# Patient Record
Sex: Female | Born: 1965 | Race: White | Hispanic: No | Marital: Married | Smoking: Former smoker
Health system: Southern US, Community
[De-identification: ages and names within clinical notes are randomized; demographics above are authoritative.]

## PROBLEM LIST (undated history)

## (undated) DIAGNOSIS — E079 Disorder of thyroid, unspecified: Secondary | ICD-10-CM

## (undated) HISTORY — DX: Disorder of thyroid, unspecified: E07.9

## (undated) HISTORY — PX: APPENDECTOMY: SHX54

## (undated) HISTORY — PX: TOE SURGERY: SHX1073

---

## 2011-06-25 ENCOUNTER — Ambulatory Visit: Payer: Self-pay

## 2011-10-31 DIAGNOSIS — E039 Hypothyroidism, unspecified: Secondary | ICD-10-CM | POA: Insufficient documentation

## 2011-11-28 DIAGNOSIS — M79609 Pain in unspecified limb: Secondary | ICD-10-CM | POA: Insufficient documentation

## 2011-11-28 DIAGNOSIS — R350 Frequency of micturition: Secondary | ICD-10-CM | POA: Insufficient documentation

## 2012-08-16 IMAGING — CR DG KNEE COMPLETE 4+V*R*
1 series · 4 of 4 positions shown · non-contrast
Comparison: none

REASON FOR EXAM: swollen
COMMENTS:

[Series 1: view not recorded · 0.17mm/px · 4 of 4 slices shown]
[im 1/4]
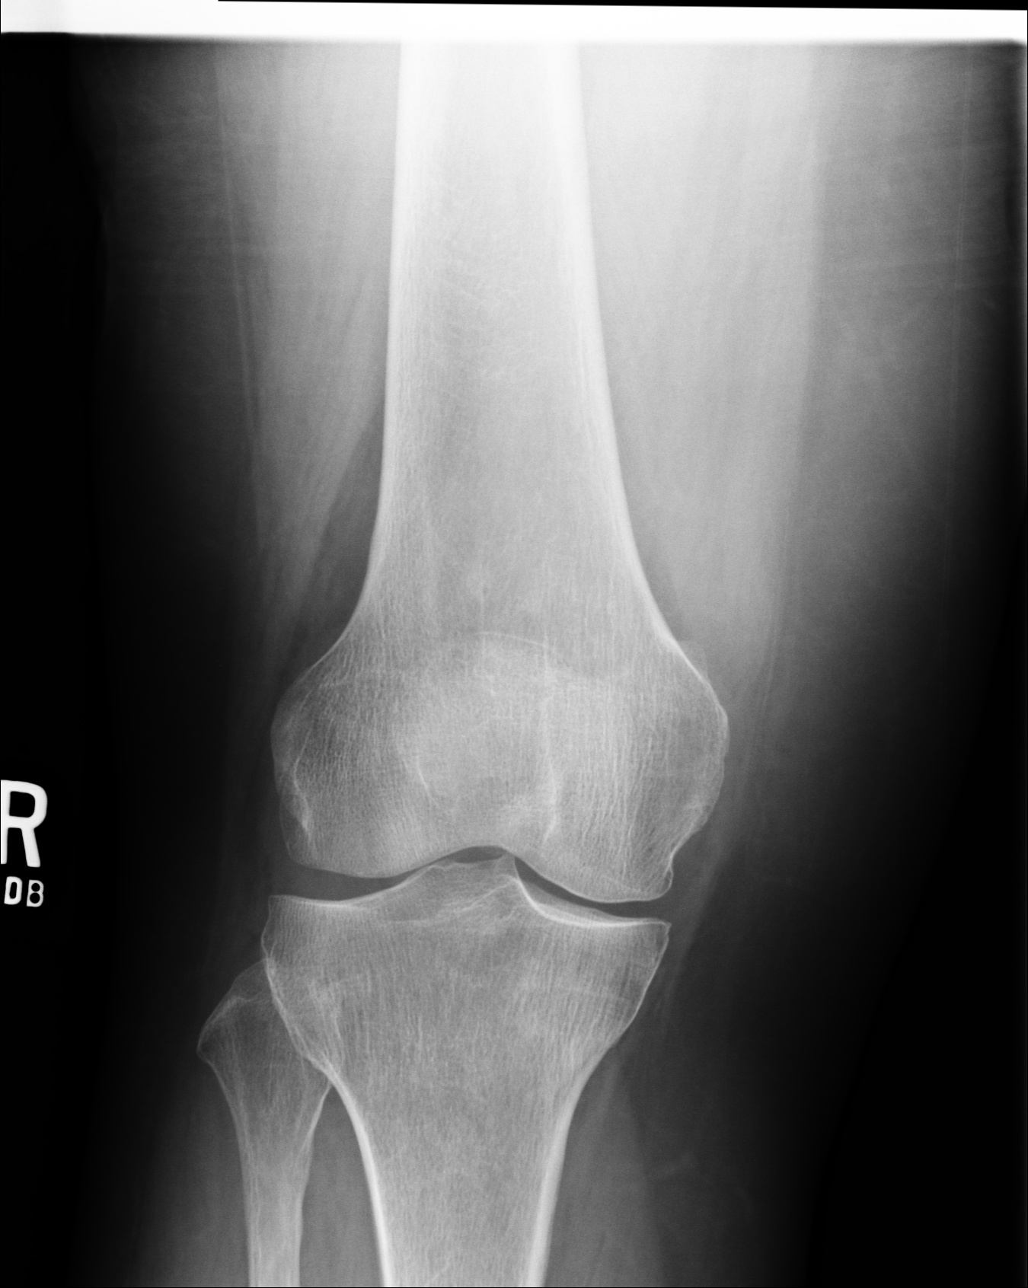
[im 2/4]
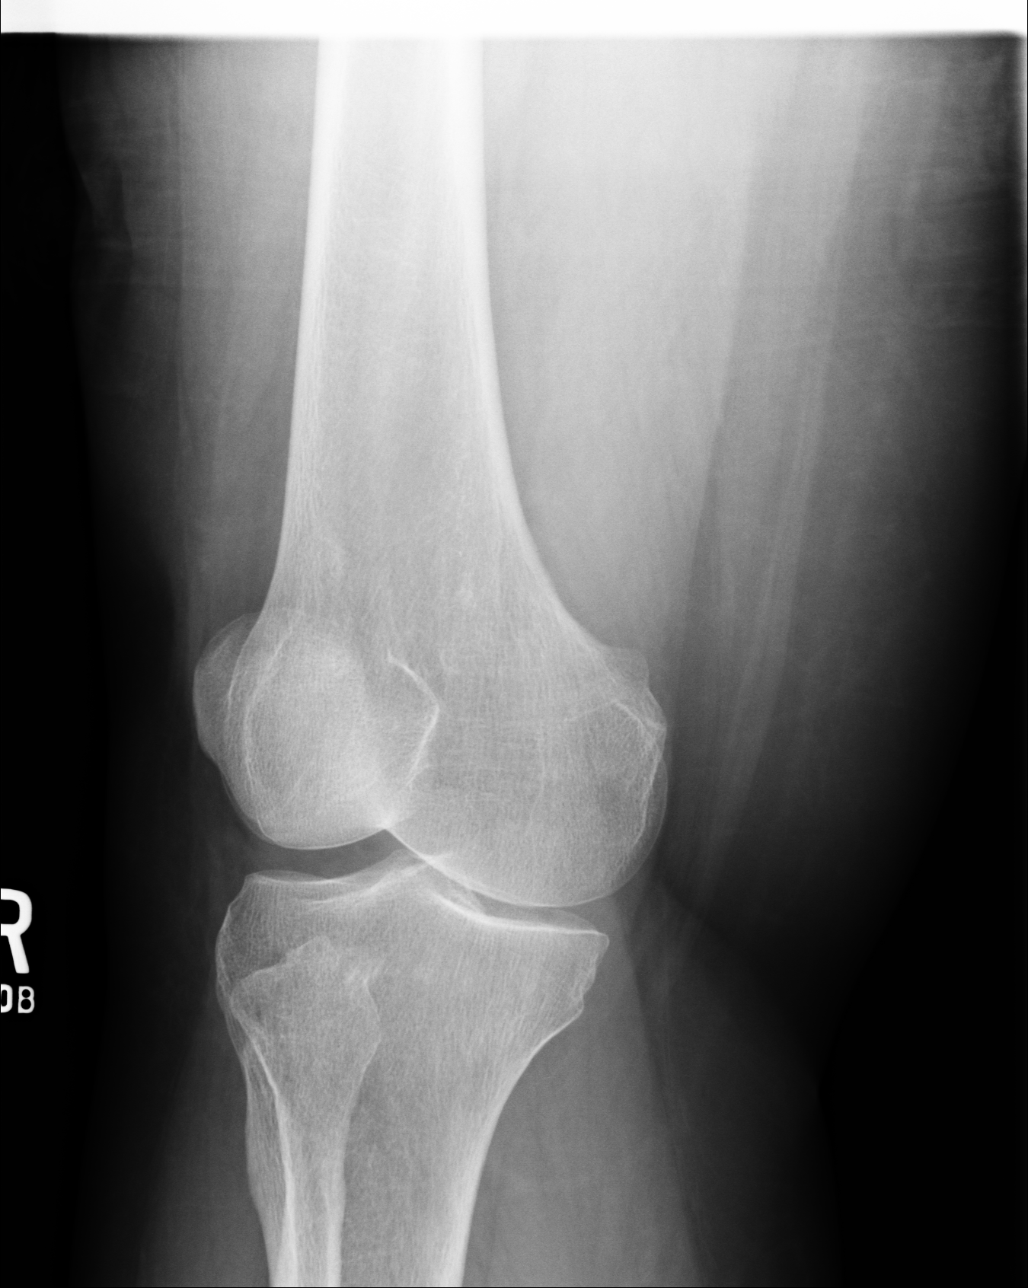
[im 3/4]
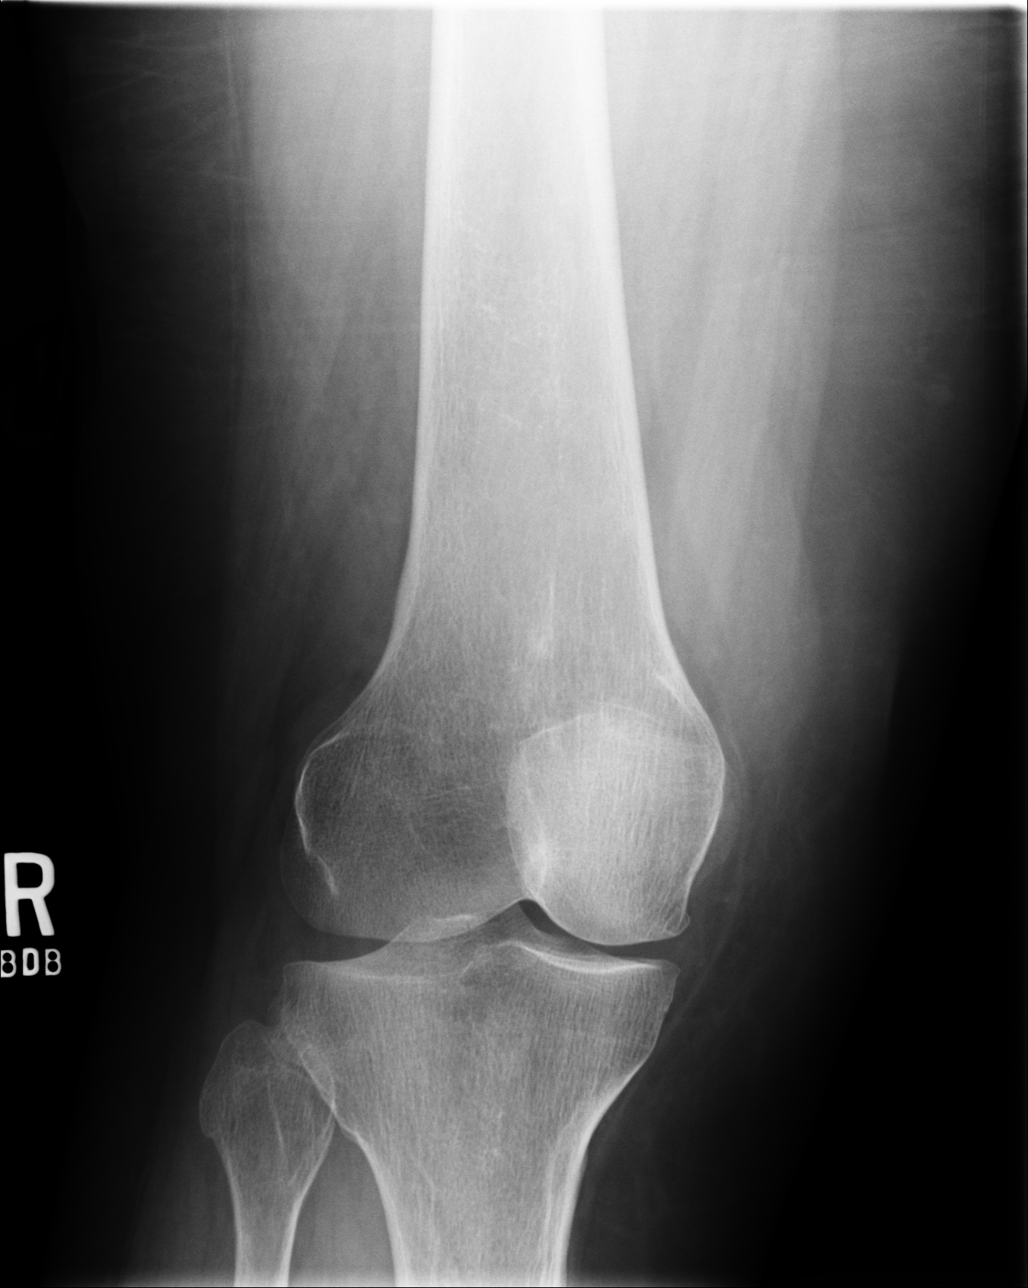
[im 4/4]
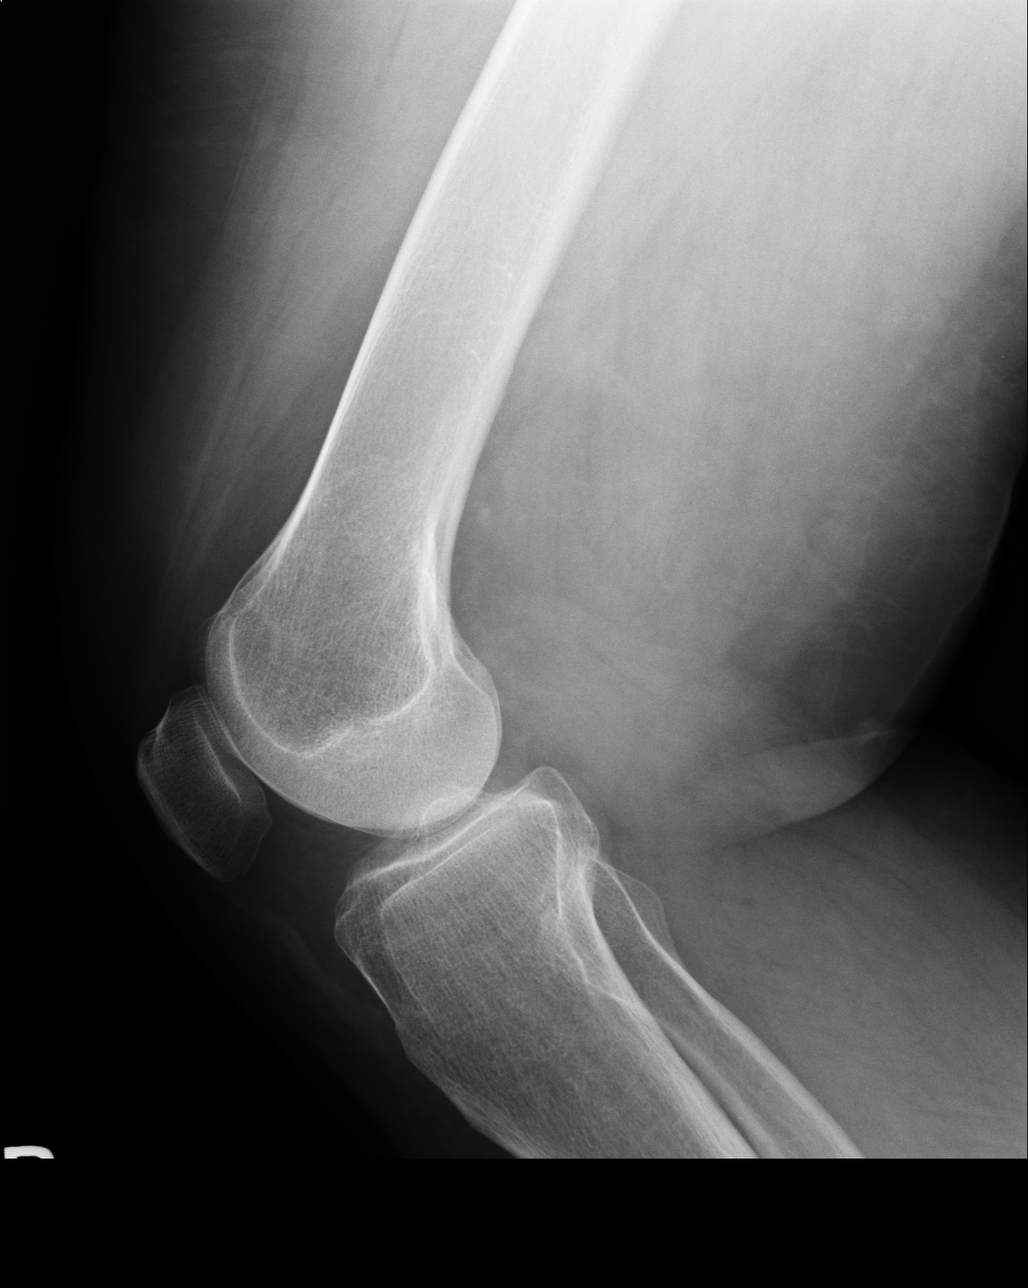

[4 of 4 positions shown; findings below may reference images not displayed]

PROCEDURE:     MDR - MDR KNEE RT COMPLETE W/OBLIQUES  - June 25, 2011 [DATE]

RESULT:     No fracture, dislocation or other acute bony abnormality is
identified. There is observed very slight spur formation at the knee
medially consistent with arthritic change. The knee joint space medially
appears slightly narrowed which also would be consistent with arthritic
change. There is no sclerosis of the adjacent articular surfaces and no
associated cystic changes about the knee joint are seen. The patella is
intact.
IMPRESSION: 1. There are minimal arthritic changes observed medially at the right knee
as noted above.
2. No fracture or other acute bony abnormality is seen.

## 2014-02-17 DIAGNOSIS — R5383 Other fatigue: Secondary | ICD-10-CM | POA: Insufficient documentation

## 2014-02-17 DIAGNOSIS — E669 Obesity, unspecified: Secondary | ICD-10-CM | POA: Insufficient documentation

## 2014-09-01 ENCOUNTER — Ambulatory Visit: Payer: Self-pay | Admitting: Emergency Medicine

## 2014-09-21 ENCOUNTER — Ambulatory Visit (INDEPENDENT_AMBULATORY_CARE_PROVIDER_SITE_OTHER): Payer: Managed Care, Other (non HMO)

## 2014-09-21 ENCOUNTER — Ambulatory Visit (INDEPENDENT_AMBULATORY_CARE_PROVIDER_SITE_OTHER): Payer: Managed Care, Other (non HMO) | Admitting: Podiatry

## 2014-09-21 ENCOUNTER — Encounter: Payer: Self-pay | Admitting: Podiatry

## 2014-09-21 VITALS — BP 140/84 | HR 65 | Resp 16 | Ht 69.0 in | Wt 218.0 lb

## 2014-09-21 DIAGNOSIS — M2042 Other hammer toe(s) (acquired), left foot: Secondary | ICD-10-CM

## 2014-09-21 DIAGNOSIS — M779 Enthesopathy, unspecified: Secondary | ICD-10-CM

## 2014-09-21 DIAGNOSIS — M898X0 Other specified disorders of bone, multiple sites: Secondary | ICD-10-CM

## 2014-09-21 DIAGNOSIS — Q786 Multiple congenital exostoses: Secondary | ICD-10-CM

## 2014-09-21 MED ORDER — TRIAMCINOLONE ACETONIDE 10 MG/ML IJ SUSP: 10.0000 mg | Freq: Once | INTRAMUSCULAR | Status: DC

## 2014-09-21 NOTE — Progress Notes (Signed)
   Subjective:    Patient ID: Destiny BurnerRuth Bernice Williamson, female    DOB: [birth year], 48 y.o.   MRN: [id number]  HPI Comments: The right ankle has a burning sensation and swells by the end of the day, also my little toe on the left foot has a spot on it that rubs and it is real irritating   Foot Pain      Review of Systems  All other systems reviewed and are negative.      Objective:   Physical Exam        Assessment & Plan:

## 2014-09-21 NOTE — Progress Notes (Signed)
Subjective:     Patient ID: Destiny BurnerRuth Bernice Williamson, female   DOB: 06/22/1966, 48 y.o.   MRN: [id number]  HPI patient states I have a lot of pain in the lateral side of my right ankle and a very painful corn on the fifth toe left foot. You fixed 1 on my right foot years ago but this left one has been really bothering me and my ankles been hurting me for several months   Review of Systems  All other systems reviewed and are negative.      Objective:   Physical Exam  Constitutional: She is oriented to person, place, and time.  Cardiovascular: Intact distal pulses.   Musculoskeletal: Normal range of motion.  Neurological: She is oriented to person, place, and time.  Skin: Skin is warm.  Nursing note and vitals reviewed.  neurovascular status intact with muscle strength adequate and range of motion the subtalar and midtarsal joint normal. She does have some splinting on the lateral side of the right foot secondary to her pain which is in the sinus tarsi and the lateral ankle gutter but I did not note any crepitus within the joint surface. Left fifth toe inside there is a small keratotic lesion consistent with bone spur     Assessment:     Lateral side tendinitis around the subtalar joint and mild discomfort in the sinus tarsi right along with exostosis with keratotic lesion fifth toe left    Plan:     Reviewed both conditions and today did a careful injection of the lateral side right 3 mm Kenalog 5 mg Xylocaine and debrided the lesion on the left fifth toe and discussed probable exostectomy to be done in the office. Patient will reappoint 2 weeks to see how she responded to medication and to decide what else may be appropriate

## 2014-10-08 ENCOUNTER — Ambulatory Visit: Payer: Managed Care, Other (non HMO) | Admitting: Podiatry

## 2014-10-08 ENCOUNTER — Ambulatory Visit (INDEPENDENT_AMBULATORY_CARE_PROVIDER_SITE_OTHER): Payer: Managed Care, Other (non HMO) | Admitting: Podiatry

## 2014-10-08 VITALS — BP 128/72 | HR 79 | Resp 16

## 2014-10-08 DIAGNOSIS — M779 Enthesopathy, unspecified: Secondary | ICD-10-CM

## 2014-10-08 DIAGNOSIS — Q786 Multiple congenital exostoses: Secondary | ICD-10-CM

## 2014-10-08 DIAGNOSIS — M898X0 Other specified disorders of bone, multiple sites: Secondary | ICD-10-CM

## 2014-10-08 DIAGNOSIS — M2042 Other hammer toe(s) (acquired), left foot: Secondary | ICD-10-CM

## 2014-10-10 NOTE — Progress Notes (Signed)
Subjective:     Patient ID: Destiny BurnerRuth Bernice Williamson, female   DOB: 17-Oct-1965, 49 y.o.   MRN: [id number]  HPI patient states I'm doing pretty good with discomfort upon pressure and digital deformity still noted left over right but able to walk better without significant discomfort   Review of Systems     Objective:   Physical Exam Neurovascular status intact with diminishment of discomfort peroneal insertion bilateral with mild hammertoe deformities and exostotic lesion between the toes left over right foot    Assessment:     Improving tendinitis with treatment and hammertoe exostotic deformity    Plan:     Reviewed both conditions and recommended padding for between the toes along with wider shoes with considerations long-term for digital stabilization procedure. Continue ice therapy for the tendon and reappoint if symptoms persist

## 2014-12-21 ENCOUNTER — Ambulatory Visit: Payer: Self-pay | Admitting: Physician Assistant

## 2014-12-21 ENCOUNTER — Ambulatory Visit: Payer: Self-pay | Admitting: Internal Medicine

## 2014-12-21 ENCOUNTER — Inpatient Hospital Stay: Payer: Self-pay | Admitting: Surgery

## 2014-12-31 LAB — URINE CULTURE

## 2015-01-24 LAB — SURGICAL PATHOLOGY

## 2015-01-30 NOTE — H&P (Signed)
PATIENT NAME:  Destiny Williamson, Destiny Williamson MR#:  [id number] DATE OF BIRTH:  07-10-1966  DATE OF ADMISSION:  12/21/2014  [facility]   ADMITTING PHYSICIAN: Dr. Michela PitcherEly.   CHIEF COMPLAINT: Abdominal pain.   BRIEF HISTORY: Destiny FewRuth Williamson is a 49 year old woman with a 2 day history of significant right lower quadrant suprapubic, right-sided abdominal pain. The pain came on suddenly, was of sharp onset, associated with anorexia and mild nausea. She did not vomit. The pain worsened over the first 24 hours.  It was accompanied by a low-grade fever and 2 shaking chills. Because she continued to have symptoms she presented to the acute care unit in [place] today, where workup was really unremarkable with regard to her laboratory values, but CT scan did demonstrate a thickened inflamed appendix with periappendiceal fluid and a question of periappendiceal air, suggesting possible rupture. The patient denies any similar previous problems. She denies any history of hepatitis, yellow jaundice, pancreatitis, peptic ulcer disease, gallbladder disease, or diverticulitis. She has had no previous abdominal surgery. She has had normal bowel function up through yesterday.   PAST MEDICAL HISTORY:  She has no major medical problems except for hypothyroidism.  MEDICATIONS:  She is currently on thyroid replacement at 0.125 mg daily.   She denies history of cardiac disease, hypertension, or diabetes.   ALLERGIES:  She has no medical allergies.   SOCIAL HISTORY:  She does smoke cigarettes. Does not drink alcohol.  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   FAMILY HISTORY: Positive for cancer, cardiac disease, and hypertension.   REVIEW OF SYSTEMS:  Otherwise unremarkable. Specifically she has no shortness of breath and no urinary symptoms. A 10 point review of systems was performed and is otherwise unremarkable.   PHYSICAL EXAMINATION:  GENERAL: She is an alert, pleasant woman in no significant  distress.  VITAL SIGNS: Blood pressure is 128/80, heart rate is 80. She is afebrile.  HEENT: Normal ears. Normal eyes with no scleral icterus and no pupillary abnormalities.  LYMPHATIC:  No cervical or axillary adenopathy.  CHEST: Clear with no adventitious sounds. She has normal pulmonary excursion.  CARDIAC: No murmurs or gallops to my ear and seems to be in normal sinus rhythm.  ABDOMEN:  Generally soft, but she does have point tenderness in the right lower quadrant with marked guarding and rebound with referred rebound. She has hypoactive but present bowel sounds.  EXTREMITIES:  Full range of motion, no deformities, as does her upper extremity examination.  NEUROLOGIC:  No cranial nerve abnormalities and full symmetrical range of motion bilaterally.  SKIN: No major lesions, abrasions, or contusions.  PSYCHIATRIC:  Normal orientation, normal affect.    Impression:   I reviewed her CT scan. She does appear to have a thickened, inflamed appendix with periappendiceal fluid and in this clinical presentation most consistent with acute appendicitis. Because of the question of rupture we would recommend surgical intervention.  We have outlined the risks and benefits of surgery including the possibility of secondary pelvic abscess following surgery. Her husband was present for the interview. They are in agreement with this plan.   TOTAL TIME SPENT:  50 minutes.     ____________________________ Quentin Orealph L. Ely III, MD rle:bu D: 12/21/2014 13:14:27 ET T: 12/21/2014 13:29:06 ET JOB#: [id number]  cc: Quentin Orealph L. Ely III, MD, <Dictator> Quentin OreALPH L ELY MD ELECTRONICALLY SIGNED 12/28/2014 20:46

## 2015-01-30 NOTE — Op Note (Signed)
PATIENT NAME:  Laurey ArrowMORALES, Galit B MR#:  [id number] DATE OF BIRTH:  [birth year]  DATE OF PROCEDURE:  12/21/2014  PREOPERATIVE DIAGNOSIS: Acute appendicitis.   POSTOPERATIVE DIAGNOSIS: Acute gangrenous appendicitis.   PROCEDURE PERFORMED: Laparoscopic appendectomy.   ANESTHESIA: General.   ESTIMATED BLOOD LOSS: 15 mL.  COMPLICATIONS: None.   SPECIMEN: Appendix.   INDICATION FOR SURGERY: Ms. Mariah MillingMorales is a pleasant, 49 year old female, who presents with acute onset of right lower quadrant pain and CT findings concerning for appendicitis.   DETAILS OF PROCEDURE: Informed consent was obtained. Ms. Mariah MillingMorales was brought to the operating room suite. She was induced. Endotracheal tube was placed. General anesthesia was administered. Her abdomen was prepped and draped in standard surgical fashion. A timeout was then performed correctly identifying patient name, operative site and procedure to be performed. A supraumbilical incision was made. It was deepened down to the fascia. The fascia was incised. The peritoneum was entered. Two stay sutures were placed through the fasciotomy. A Hasson trocar was placed in the abdomen. The abdomen was insufflated. A left lower quadrant and suprapubic trocar were placed. I was able to visualize the base of the appendix; however, the tip was extremely thickened and stuck to the surroundings tissues. I then made a hole in the mesoappendix at the base of the appendix and fired across with a laparoscopic stapler. I then proceeded to free up the appendix from the retroperitoneum with blunt dissection,and proceeded to separate the appendix from what appeared to be the bottom of the cecum. I then fired across the mesoappendix with multiple staple loads and brought it out in pieces through an Endo Catch bag through the umbilical incision. I then irrigated the abdomen with normal saline. I looked for hemostasis. I looked for any traumatic injuries to the cecum as it was stuck to the  cecum. In addition, I will note that the appendix appeared gangrenous.   After I was happy with hemostasis, the abdomen was desufflated, trocars were removed under direct visualization. The supraumbilical fascia was closed with figure-of-eight 0 Vicryl. The skin was then closed with 4-0 Monocryl deep dermal sutures. Steri-Strips, Telfa gauze and Tegaderm were used to complete the dressing.   The patient was then awoken, extubated and brought to the postanesthesia care unit. There were no immediate complications.   Needle, sponge, and instrument counts were correct at the end of the procedure.    ____________________________ Si Raiderhristopher A. Dandrea Medders, MD cal:JT D: 12/21/2014 23:13:58 ET T: 12/22/2014 08:40:07 ET JOB#: [id number]  cc: Cristal Deerhristopher A. Calin Fantroy, MD, <Dictator> Jarvis NewcomerHRISTOPHER A Rashun Grattan MD ELECTRONICALLY SIGNED 12/23/2014 19:47

## 2015-02-11 ENCOUNTER — Encounter: Payer: Self-pay | Admitting: Podiatry

## 2015-02-11 ENCOUNTER — Ambulatory Visit (INDEPENDENT_AMBULATORY_CARE_PROVIDER_SITE_OTHER): Payer: Managed Care, Other (non HMO) | Admitting: Podiatry

## 2015-02-11 VITALS — BP 130/71 | HR 61 | Resp 12

## 2015-02-11 DIAGNOSIS — M779 Enthesopathy, unspecified: Secondary | ICD-10-CM | POA: Diagnosis not present

## 2015-02-11 MED ORDER — TRIAMCINOLONE ACETONIDE 10 MG/ML IJ SUSP
10.0000 mg | Freq: Once | INTRAMUSCULAR | Status: AC
  Administered 2015-02-11: 10 mg

## 2015-02-13 NOTE — Progress Notes (Signed)
Subjective:     Patient ID: Destiny Williamson, female   DOB: Feb 16, 1966, 49 y.o.   MRN: [id number]  HPI patient states my right ankle on the outside is still sore and I wanted to make sure that there is nothing else and I know I need an injection   Review of Systems     Objective:   Physical Exam Neurovascular status intact muscle strength adequate with pain in the lateral side right ankle that is more in the lateral ankle gutter versus the sinus tarsi    Assessment:     Inflammatory tendinitis right lateral ankle with minimal sinus tarsitis noted    Plan:     Injected the right ankle 3 mg Kenalog 5 mg Xylocaine and advised on physical therapy. Reappoint to recheck

## 2015-07-06 ENCOUNTER — Encounter: Payer: Managed Care, Other (non HMO) | Admitting: Podiatry

## 2015-09-08 ENCOUNTER — Ambulatory Visit (INDEPENDENT_AMBULATORY_CARE_PROVIDER_SITE_OTHER): Payer: Managed Care, Other (non HMO) | Admitting: Podiatry

## 2015-09-08 ENCOUNTER — Ambulatory Visit (INDEPENDENT_AMBULATORY_CARE_PROVIDER_SITE_OTHER): Payer: Managed Care, Other (non HMO)

## 2015-09-08 ENCOUNTER — Encounter: Payer: Self-pay | Admitting: Podiatry

## 2015-09-08 VITALS — BP 124/77 | HR 78 | Resp 16

## 2015-09-08 DIAGNOSIS — M898X0 Other specified disorders of bone, multiple sites: Secondary | ICD-10-CM

## 2015-09-08 DIAGNOSIS — M779 Enthesopathy, unspecified: Secondary | ICD-10-CM

## 2015-09-08 DIAGNOSIS — Q786 Multiple congenital exostoses: Secondary | ICD-10-CM | POA: Diagnosis not present

## 2015-09-08 MED ORDER — TRIAMCINOLONE ACETONIDE 10 MG/ML IJ SUSP
10.0000 mg | Freq: Once | INTRAMUSCULAR | Status: AC
  Administered 2015-09-08: 10 mg

## 2015-09-08 NOTE — Patient Instructions (Signed)

## 2015-09-09 NOTE — Progress Notes (Signed)
Subjective:     Patient ID: Destiny BurnerRuth Bernice Learn, female   DOB: 06-28-1966, 49 y.o.   MRN: [id number]  HPI patient presents with pain in the right ankle medial side and lateral side and also has a small spur on the fifth toe left foot that's been bothering her. The left ankle has been doing well but just started to hurt in the last 4 weeks   Review of Systems     Objective:   Physical Exam Neurovascular status intact muscle strength adequate with discomfort in the lateral ankle gutter right with mild sinus tarsi pain. Left fifth toe has a small exostosis which can become very painful    Assessment:     Tendinitis right with inflammation along with exostotic lesion left    Plan:     Reviewed both conditions and at this time I injected the tenderness complex right 3 mg Kenalog 5 mg Xylocaine and advised on reduced activity. For the left I discussed exostectomy which she wants to get done but needs to wait until the timing is better for her

## 2015-12-30 NOTE — Progress Notes (Signed)
This encounter was created in error - please disregard.

## 2016-01-31 ENCOUNTER — Emergency Department: Payer: 59

## 2016-01-31 ENCOUNTER — Observation Stay: Payer: 59

## 2016-01-31 ENCOUNTER — Encounter
Admission: EM | Disposition: A | Discharge status: Home / Self Care | Payer: Self-pay | Source: Home / Self Care | Attending: Emergency Medicine

## 2016-01-31 ENCOUNTER — Observation Stay
Admission: EM | Admit: 2016-01-31 | Discharge: 2016-02-02 | Disposition: A | Discharge status: Home / Self Care | Payer: 59 | Attending: Surgery | Admitting: Surgery

## 2016-01-31 ENCOUNTER — Observation Stay: Payer: 59 | Admitting: Certified Registered"

## 2016-01-31 ENCOUNTER — Encounter: Payer: Self-pay | Admitting: Emergency Medicine

## 2016-01-31 DIAGNOSIS — E039 Hypothyroidism, unspecified: Secondary | ICD-10-CM | POA: Insufficient documentation

## 2016-01-31 DIAGNOSIS — K8040 Calculus of bile duct with cholecystitis, unspecified, without obstruction: Secondary | ICD-10-CM | POA: Diagnosis not present

## 2016-01-31 DIAGNOSIS — Z8379 Family history of other diseases of the digestive system: Secondary | ICD-10-CM | POA: Insufficient documentation

## 2016-01-31 DIAGNOSIS — R5383 Other fatigue: Secondary | ICD-10-CM | POA: Diagnosis not present

## 2016-01-31 DIAGNOSIS — R350 Frequency of micturition: Secondary | ICD-10-CM | POA: Insufficient documentation

## 2016-01-31 DIAGNOSIS — Z87891 Personal history of nicotine dependence: Secondary | ICD-10-CM | POA: Diagnosis not present

## 2016-01-31 DIAGNOSIS — E669 Obesity, unspecified: Secondary | ICD-10-CM | POA: Diagnosis not present

## 2016-01-31 DIAGNOSIS — K8050 Calculus of bile duct without cholangitis or cholecystitis without obstruction: Secondary | ICD-10-CM

## 2016-01-31 DIAGNOSIS — R1011 Right upper quadrant pain: Secondary | ICD-10-CM

## 2016-01-31 DIAGNOSIS — M79609 Pain in unspecified limb: Secondary | ICD-10-CM | POA: Insufficient documentation

## 2016-01-31 DIAGNOSIS — R932 Abnormal findings on diagnostic imaging of liver and biliary tract: Secondary | ICD-10-CM | POA: Insufficient documentation

## 2016-01-31 DIAGNOSIS — K8062 Calculus of gallbladder and bile duct with acute cholecystitis without obstruction: Principal | ICD-10-CM | POA: Insufficient documentation

## 2016-01-31 DIAGNOSIS — Z6834 Body mass index (BMI) 34.0-34.9, adult: Secondary | ICD-10-CM | POA: Diagnosis not present

## 2016-01-31 DIAGNOSIS — K8042 Calculus of bile duct with acute cholecystitis without obstruction: Secondary | ICD-10-CM | POA: Diagnosis present

## 2016-01-31 DIAGNOSIS — Z8249 Family history of ischemic heart disease and other diseases of the circulatory system: Secondary | ICD-10-CM | POA: Insufficient documentation

## 2016-01-31 DIAGNOSIS — K838 Other specified diseases of biliary tract: Secondary | ICD-10-CM | POA: Insufficient documentation

## 2016-01-31 DIAGNOSIS — Z833 Family history of diabetes mellitus: Secondary | ICD-10-CM | POA: Insufficient documentation

## 2016-01-31 DIAGNOSIS — K819 Cholecystitis, unspecified: Secondary | ICD-10-CM

## 2016-01-31 HISTORY — PX: CHOLECYSTECTOMY: SHX55

## 2016-01-31 LAB — BASIC METABOLIC PANEL
Anion gap: 9 (ref 5–15)
BUN: 23 mg/dL — AB (ref 6–20)
CO2: 24 mmol/L (ref 22–32)
CREATININE: 0.98 mg/dL (ref 0.44–1.00)
Calcium: 8.8 mg/dL — ABNORMAL LOW (ref 8.9–10.3)
Chloride: 107 mmol/L (ref 101–111)
GFR calc Af Amer: >60 mL/min (ref >60)
Glucose, Bld: 112 mg/dL — ABNORMAL HIGH (ref 65–99)
Potassium: 3.6 mmol/L (ref 3.5–5.1)
SODIUM: 140 mmol/L (ref 135–145)

## 2016-01-31 LAB — CBC
HCT: 40.1 % (ref 35.0–47.0)
Hemoglobin: 13.6 g/dL (ref 12.0–16.0)
MCH: 28.5 pg (ref 26.0–34.0)
MCHC: 33.9 g/dL (ref 32.0–36.0)
MCV: 84.1 fL (ref 80.0–100.0)
PLATELETS: 260 10*3/uL (ref 150–440)
RBC: 4.77 MIL/uL (ref 3.80–5.20)
RDW: 14.1 % (ref 11.5–14.5)
WBC: 6.6 10*3/uL (ref 3.6–11.0)

## 2016-01-31 LAB — HEPATIC FUNCTION PANEL
ALBUMIN: 4 g/dL (ref 3.5–5.0)
ALT: 16 U/L (ref 14–54)
AST: 17 U/L (ref 15–41)
Alkaline Phosphatase: 85 U/L (ref 38–126)
BILIRUBIN TOTAL: 0.4 mg/dL (ref 0.3–1.2)
Bilirubin, Direct: <0.1 mg/dL — ABNORMAL LOW (ref 0.1–0.5)
TOTAL PROTEIN: 7.4 g/dL (ref 6.5–8.1)

## 2016-01-31 LAB — PREGNANCY, URINE: PREG TEST UR: NEGATIVE

## 2016-01-31 LAB — URINALYSIS COMPLETE WITH MICROSCOPIC (ARMC ONLY)
BILIRUBIN URINE: NEGATIVE
Bacteria, UA: NONE SEEN
GLUCOSE, UA: NEGATIVE mg/dL
Hgb urine dipstick: NEGATIVE
KETONES UR: NEGATIVE mg/dL
LEUKOCYTES UA: NEGATIVE
Nitrite: NEGATIVE
PH: 7 (ref 5.0–8.0)
Protein, ur: NEGATIVE mg/dL
Specific Gravity, Urine: 1.013 (ref 1.005–1.030)

## 2016-01-31 LAB — LIPASE, BLOOD: LIPASE: 31 U/L (ref 11–51)

## 2016-01-31 LAB — TROPONIN I: Troponin I: <0.03 ng/mL (ref <0.031)

## 2016-01-31 SURGERY — LAPAROSCOPIC CHOLECYSTECTOMY WITH INTRAOPERATIVE CHOLANGIOGRAM
Anesthesia: General

## 2016-01-31 MED ORDER — OXYCODONE HCL 5 MG PO TABS
5.0000 mg | ORAL_TABLET | ORAL | Status: DC | PRN
  Administered 2016-02-01: 5 mg via ORAL
  Filled 2016-01-31 (×2): qty 1

## 2016-01-31 MED ORDER — DEXTROSE 5 % IV SOLN
2.0000 g | Freq: Once | INTRAVENOUS | Status: AC
  Administered 2016-01-31: 2 g via INTRAVENOUS
  Filled 2016-01-31: qty 2

## 2016-01-31 MED ORDER — BUPIVACAINE HCL (PF) 0.5 % IJ SOLN
INTRAMUSCULAR | Status: DC | PRN
  Administered 2016-01-31: 30 mL

## 2016-01-31 MED ORDER — ONDANSETRON HCL 4 MG/2ML IJ SOLN
4.0000 mg | Freq: Four times a day (QID) | INTRAMUSCULAR | Status: DC | PRN
  Administered 2016-01-31 – 2016-02-01 (×2): 4 mg via INTRAVENOUS
  Filled 2016-01-31: qty 2

## 2016-01-31 MED ORDER — OXYCODONE HCL 5 MG PO TABS: 5.0000 mg | ORAL_TABLET | Freq: Once | ORAL | Status: DC | PRN

## 2016-01-31 MED ORDER — ROCURONIUM BROMIDE 100 MG/10ML IV SOLN
INTRAVENOUS | Status: DC | PRN
  Administered 2016-01-31: 10 mg via INTRAVENOUS
  Administered 2016-01-31: 25 mg via INTRAVENOUS
  Administered 2016-01-31: 40 mg via INTRAVENOUS

## 2016-01-31 MED ORDER — FENTANYL CITRATE (PF) 100 MCG/2ML IJ SOLN: 25.0000 ug | INTRAMUSCULAR | Status: DC | PRN

## 2016-01-31 MED ORDER — MORPHINE SULFATE (PF) 4 MG/ML IV SOLN
4.0000 mg | Freq: Once | INTRAVENOUS | Status: AC
  Administered 2016-01-31: 4 mg via INTRAVENOUS
  Filled 2016-01-31: qty 1

## 2016-01-31 MED ORDER — PANTOPRAZOLE SODIUM 40 MG IV SOLR
40.0000 mg | Freq: Every day | INTRAVENOUS | Status: DC
  Administered 2016-01-31 – 2016-02-01 (×2): 40 mg via INTRAVENOUS
  Filled 2016-01-31 (×2): qty 40

## 2016-01-31 MED ORDER — SUGAMMADEX SODIUM 200 MG/2ML IV SOLN
INTRAVENOUS | Status: DC | PRN
  Administered 2016-01-31: 200 mg via INTRAVENOUS

## 2016-01-31 MED ORDER — CYCLOBENZAPRINE HCL 10 MG PO TABS
10.0000 mg | ORAL_TABLET | Freq: Three times a day (TID) | ORAL | Status: DC
  Administered 2016-01-31 – 2016-02-02 (×4): 10 mg via ORAL
  Filled 2016-01-31 (×5): qty 1

## 2016-01-31 MED ORDER — SODIUM CHLORIDE 0.9 % IV SOLN
INTRAVENOUS | Status: DC | PRN
  Administered 2016-01-31: 20 mL

## 2016-01-31 MED ORDER — BUPIVACAINE HCL (PF) 0.5 % IJ SOLN
INTRAMUSCULAR | Status: AC
Start: 2016-01-31 — End: 2016-01-31
  Filled 2016-01-31: qty 30

## 2016-01-31 MED ORDER — CEFOXITIN SODIUM 2 G IV SOLR
2.0000 g | Freq: Once | INTRAVENOUS | Status: DC
  Filled 2016-01-31: qty 2

## 2016-01-31 MED ORDER — ACETAMINOPHEN 10 MG/ML IV SOLN
INTRAVENOUS | Status: AC
  Filled 2016-01-31: qty 100

## 2016-01-31 MED ORDER — MIDAZOLAM HCL 2 MG/2ML IJ SOLN
INTRAMUSCULAR | Status: DC | PRN
  Administered 2016-01-31: 2 mg via INTRAVENOUS

## 2016-01-31 MED ORDER — CEFAZOLIN SODIUM 1 G IJ SOLR
INTRAMUSCULAR | Status: DC | PRN
  Administered 2016-01-31: 2 g via INTRAMUSCULAR

## 2016-01-31 MED ORDER — DEXTROSE IN LACTATED RINGERS 5 % IV SOLN
INTRAVENOUS | Status: DC
  Administered 2016-01-31 – 2016-02-02 (×6): via INTRAVENOUS
  Filled 2016-01-31 (×2): qty 1000

## 2016-01-31 MED ORDER — KETOROLAC TROMETHAMINE 30 MG/ML IJ SOLN
30.0000 mg | Freq: Four times a day (QID) | INTRAMUSCULAR | Status: DC
  Administered 2016-01-31 – 2016-02-02 (×6): 30 mg via INTRAVENOUS
  Filled 2016-01-31 (×5): qty 1

## 2016-01-31 MED ORDER — LIDOCAINE HCL (CARDIAC) 20 MG/ML IV SOLN
INTRAVENOUS | Status: DC | PRN
  Administered 2016-01-31: 100 mg via INTRAVENOUS

## 2016-01-31 MED ORDER — LACTATED RINGERS IV SOLN
INTRAVENOUS | Status: DC | PRN
  Administered 2016-01-31 (×2): via INTRAVENOUS

## 2016-01-31 MED ORDER — KETOROLAC TROMETHAMINE 30 MG/ML IJ SOLN: 30.0000 mg | Freq: Four times a day (QID) | INTRAMUSCULAR | Status: DC | PRN

## 2016-01-31 MED ORDER — OXYCODONE HCL 5 MG/5ML PO SOLN: 5.0000 mg | Freq: Once | ORAL | Status: DC | PRN

## 2016-01-31 MED ORDER — SUCCINYLCHOLINE CHLORIDE 20 MG/ML IJ SOLN
INTRAMUSCULAR | Status: DC | PRN
  Administered 2016-01-31: 100 mg via INTRAVENOUS

## 2016-01-31 MED ORDER — ONDANSETRON 8 MG PO TBDP: 4.0000 mg | ORAL_TABLET | Freq: Four times a day (QID) | ORAL | Status: DC | PRN

## 2016-01-31 MED ORDER — ACETAMINOPHEN 10 MG/ML IV SOLN
INTRAVENOUS | Status: DC | PRN
  Administered 2016-01-31: 1000 mg via INTRAVENOUS

## 2016-01-31 MED ORDER — LEVOTHYROXINE SODIUM 125 MCG PO TABS
125.0000 ug | ORAL_TABLET | Freq: Every day | ORAL | Status: DC
  Administered 2016-02-02: 125 ug via ORAL
  Filled 2016-01-31 (×2): qty 1

## 2016-01-31 MED ORDER — MORPHINE SULFATE (PF) 2 MG/ML IV SOLN
2.0000 mg | INTRAVENOUS | Status: DC | PRN
  Administered 2016-02-01: 2 mg via INTRAVENOUS
  Filled 2016-01-31: qty 1

## 2016-01-31 MED ORDER — FENTANYL CITRATE (PF) 100 MCG/2ML IJ SOLN
INTRAMUSCULAR | Status: DC | PRN
  Administered 2016-01-31: 100 ug via INTRAVENOUS
  Administered 2016-01-31 (×2): 50 ug via INTRAVENOUS

## 2016-01-31 MED ORDER — PROPOFOL 10 MG/ML IV BOLUS
INTRAVENOUS | Status: DC | PRN
  Administered 2016-01-31: 150 mg via INTRAVENOUS

## 2016-01-31 MED ORDER — DEXAMETHASONE SODIUM PHOSPHATE 10 MG/ML IJ SOLN
INTRAMUSCULAR | Status: DC | PRN
  Administered 2016-01-31: 10 mg via INTRAVENOUS

## 2016-01-31 MED ORDER — ONDANSETRON HCL 4 MG/2ML IJ SOLN
4.0000 mg | Freq: Once | INTRAMUSCULAR | Status: AC
  Administered 2016-01-31: 4 mg via INTRAVENOUS
  Filled 2016-01-31: qty 2

## 2016-01-31 SURGICAL SUPPLY — 40 items
APPLIER CLIP 5 13 M/L LIGAMAX5 (MISCELLANEOUS) ×2
BLADE SURG 15 STRL LF DISP TIS (BLADE) ×1 IMPLANT
BLADE SURG 15 STRL SS (BLADE) ×1
CANISTER SUCT 1200ML W/VALVE (MISCELLANEOUS) ×2 IMPLANT
CATH CHOLANGI 4FR 420404F (CATHETERS) ×2 IMPLANT
CHLORAPREP W/TINT 26ML (MISCELLANEOUS) ×2 IMPLANT
CLIP APPLIE 5 13 M/L LIGAMAX5 (MISCELLANEOUS) ×1 IMPLANT
CONRAY 60ML FOR OR (MISCELLANEOUS) ×2 IMPLANT
DEFOGGER SCOPE WARMER CLEARIFY (MISCELLANEOUS) ×2 IMPLANT
DRAPE SHEET LG 3/4 BI-LAMINATE (DRAPES) ×2 IMPLANT
ELECT CAUTERY BLADE 6.4 (BLADE) ×2 IMPLANT
ELECT E-Z MONOPOLAR 33 (MISCELLANEOUS) ×2
ELECT REM PT RETURN 9FT ADLT (ELECTROSURGICAL) ×2
ELECTRODE E-Z MONOPOLAR 33 (MISCELLANEOUS) ×1 IMPLANT
ELECTRODE REM PT RTRN 9FT ADLT (ELECTROSURGICAL) ×1 IMPLANT
ENDOPOUCH RETRIEVER 10 (MISCELLANEOUS) ×2 IMPLANT
GLOVE PI ORTHOPRO 6.5 (GLOVE) ×1
GLOVE PI ORTHOPRO STRL 6.5 (GLOVE) ×1 IMPLANT
GOWN STRL REUS W/ TWL LRG LVL3 (GOWN DISPOSABLE) ×3 IMPLANT
GOWN STRL REUS W/TWL LRG LVL3 (GOWN DISPOSABLE) ×3
IRRIGATION STRYKERFLOW (MISCELLANEOUS) ×1 IMPLANT
IRRIGATOR STRYKERFLOW (MISCELLANEOUS) ×2
IV CATH ANGIO 12GX3 LT BLUE (NEEDLE) ×2 IMPLANT
IV NS 1000ML (IV SOLUTION) ×1
IV NS 1000ML BAXH (IV SOLUTION) ×1 IMPLANT
LABEL OR SOLS (LABEL) ×2 IMPLANT
LIQUID BAND (GAUZE/BANDAGES/DRESSINGS) ×2 IMPLANT
NEEDLE HYPO 25X1 1.5 SAFETY (NEEDLE) ×2 IMPLANT
NS IRRIG 500ML POUR BTL (IV SOLUTION) ×2 IMPLANT
PACK LAP CHOLECYSTECTOMY (MISCELLANEOUS) ×2 IMPLANT
PENCIL ELECTRO HAND CTR (MISCELLANEOUS) ×2 IMPLANT
SCISSORS METZENBAUM CVD 33 (INSTRUMENTS) ×2 IMPLANT
SLEEVE ENDOPATH XCEL 5M (ENDOMECHANICALS) ×4 IMPLANT
SUT MNCRL 4-0 (SUTURE) ×1
SUT MNCRL 4-0 27XMFL (SUTURE) ×1
SUT VICRYL 0 AB UR-6 (SUTURE) ×2 IMPLANT
SUTURE MNCRL 4-0 27XMF (SUTURE) ×1 IMPLANT
TROCAR XCEL BLUNT TIP 100MML (ENDOMECHANICALS) ×2 IMPLANT
TROCAR XCEL NON-BLD 5MMX100MML (ENDOMECHANICALS) ×2 IMPLANT
TUBING INSUFFLATOR HI FLOW (MISCELLANEOUS) ×2 IMPLANT

## 2016-01-31 NOTE — Transfer of Care (Signed)
Immediate Anesthesia Transfer of Care Note  Patient: Destiny Williamson  Procedure(s) Performed: Procedure(s): LAPAROSCOPIC CHOLECYSTECTOMY WITH INTRAOPERATIVE CHOLANGIOGRAM (N/A)  Patient Location: PACU  Anesthesia Type:General  Level of Consciousness: awake and patient cooperative  Airway & Oxygen Therapy: Patient Spontanous Breathing and Patient connected to face mask oxygen  Post-op Assessment: Report given to RN  Post vital signs: Reviewed and stable  Last Vitals:  Filed Vitals:   01/31/16 1413 01/31/16 1745  BP: 144/68 164/85  Pulse: 59 76  Temp: 36.7 C 37.1 C  Resp:  15    Last Pain:  Filed Vitals:   01/31/16 1748  PainSc: 6          Complications: No apparent anesthesia complications

## 2016-01-31 NOTE — Anesthesia Procedure Notes (Signed)
Procedure Name: Intubation Date/Time: 01/31/2016 4:31 PM Performed by: Junious SilkNOLES, Ashya Nicolaisen Pre-anesthesia Checklist: Patient identified, Patient being monitored, Timeout performed, Emergency Drugs available and Suction available Patient Re-evaluated:Patient Re-evaluated prior to inductionOxygen Delivery Method: Circle system utilized Preoxygenation: Pre-oxygenation with 100% oxygen Intubation Type: IV induction Ventilation: Mask ventilation without difficulty Laryngoscope Size: Mac and 3 Grade View: Grade I Tube type: Oral Tube size: 7.0 mm Number of attempts: 1 Airway Equipment and Method: Stylet Placement Confirmation: ETT inserted through vocal cords under direct vision,  positive ETCO2 and breath sounds checked- equal and bilateral Secured at: 21 cm Tube secured with: Tape Dental Injury: Teeth and Oropharynx as per pre-operative assessment

## 2016-01-31 NOTE — Anesthesia Preprocedure Evaluation (Signed)
Anesthesia Evaluation  Patient identified by MRN, date of birth, ID band Patient awake    Reviewed: Allergy & Precautions, H&P , NPO status , Patient's Chart, lab work & pertinent test results  History of Anesthesia Complications Negative for: history of anesthetic complications  Airway Mallampati: III  TM Distance: >3 FB Neck ROM: full    Dental  (+) Poor Dentition, Chipped   Pulmonary neg shortness of breath, former smoker,    Pulmonary exam normal breath sounds clear to auscultation       Cardiovascular Exercise Tolerance: Good (-) angina(-) Past MI and (-) DOE negative cardio ROS Normal cardiovascular exam Rhythm:regular Rate:Normal     Neuro/Psych negative neurological ROS  negative psych ROS   GI/Hepatic negative GI ROS, Neg liver ROS, neg GERD  ,  Endo/Other  Hypothyroidism   Renal/GU negative Renal ROS  negative genitourinary   Musculoskeletal   Abdominal   Peds  Hematology negative hematology ROS (+)   Anesthesia Other Findings Past Medical History:   Thyroid disease                                             Past Surgical History:   TOE SURGERY                                                   APPENDECTOMY                                                 BMI    Body Mass Index   34.97 kg/m 2      Reproductive/Obstetrics negative OB ROS                             Anesthesia Physical Anesthesia Plan  ASA: III  Anesthesia Plan: General ETT   Post-op Pain Management:    Induction:   Airway Management Planned:   Additional Equipment:   Intra-op Plan:   Post-operative Plan:   Informed Consent: I have reviewed the patients History and Physical, chart, labs and discussed the procedure including the risks, benefits and alternatives for the proposed anesthesia with the patient or authorized representative who has indicated his/her understanding and acceptance.    Dental Advisory Given  Plan Discussed with: Anesthesiologist, CRNA and Surgeon  Anesthesia Plan Comments:         Anesthesia Quick Evaluation

## 2016-01-31 NOTE — ED Notes (Signed)
Pt anxious and tearful. Explained to pt that we are carefully monitoring her and that we can see everything on her monitor out at the desk. Pt visibly relaxed and verbalized understanding.

## 2016-01-31 NOTE — ED Notes (Addendum)
Pt ambulatory to STAT registration desk without difficulty, tearful; reports awakening at 4am with upper CP radiating into back and underneath breasts; denies hx of same; accomp by nausea; pt taken immediately to room 3 via w/c by Lauryn RN to be placed on card monitor for EKG

## 2016-01-31 NOTE — Op Note (Signed)
Laparoscopic Cholecystectomy with IOC Procedure Note  Indications: This patient presents with symptomatic gallbladder disease and will undergo laparoscopic cholecystectomy.  Pre-operative Diagnosis: Calculus of bile duct with acute cholecystitis and obstruction  Post-operative Diagnosis: Same  Surgeon: Gladis Riffleatherine L Vinay Ertl   Assistants: none  Anesthesia: General endotracheal anesthesia  ASA Class: 2  Procedure Details  The patient was seen again in the Holding Room. The risks, benefits, complications, treatment options, and expected outcomes were discussed with the patient. The possibilities of reaction to medication, pulmonary aspiration, perforation of viscus, bleeding, recurrent infection, finding a normal gallbladder, the need for additional procedures, failure to diagnose a condition, the possible need to convert to an open procedure, and creating a complication requiring transfusion or operation were discussed with the patient. The patient and husband concurred with the proposed plan, giving informed consent. The site of surgery properly noted/marked. The patient was taken to Operating Room, identified as Destiny Williamson and the procedure verified as Laparoscopic Cholecystectomy with Intraoperative Cholangiograms. A Time Out was held and the above information confirmed.  Prior to the induction of general anesthesia, antibiotic prophylaxis was administered. General endotracheal anesthesia was then administered and tolerated well. After the induction, the abdomen was prepped in the usual sterile fashion. The patient was positioned in the supine position with the left arm comfortably tucked, along with some reverse Trendelenburg.  Local anesthetic agent was injected into the skin near the umbilicus and an incision made. The midline fascia was incised and the Hasson technique was used to introduce a 10 mm port under direct vision. It was secured with a figure of eight Vicryl suture placed  in the usual fashion. Pneumoperitoneum was then created with CO2 and tolerated well without any adverse changes in the patient's vital signs. Additional trocars were introduced under direct vision. All skin incisions were infiltrated with a local anesthetic agent before making the incision and placing the trocars.   The gallbladder was identified, the fundus grasped and retracted cephalad. Adhesions were lysed bluntly and with the electrocautery where indicated, taking care not to injure any adjacent organs or viscus. The gallbladder was very inflamed and not easy to gasp. Laparoscopic needle was inserted into the gallbladder and clear fluid was returned from the gallbladder, enough so it could be grasped. The infundibulum was grasped and retracted laterally, exposing the peritoneum overlying the triangle of Calot. This was then divided and exposed in a blunt fashion. The cystic duct was clearly identified and bluntly dissected circumferentially. The junctions of the gallbladder, cystic duct and common bile duct were clearly identified prior to the division of any linear structure.   An incision was made in the cystic duct and the cholangiogram catheter introduced. The catheter was secured using an endoclip. The study showed good visualization of the proximal and distal biliary tree with dilation, without filling into the duodenum, concern for large stone the distal CBD though no frank stone was observed. The catheter was then removed.   The cystic duct was then doubly ligated with surgical clips on the patient side and singly clipped on the gallbladder side and divided. The cystic artery was identified, dissected free, ligated with clips and divided as well.   The gallbladder was dissected from the liver bed in retrograde fashion with the electrocautery. The gallbladder was removed. The liver bed was irrigated and inspected. Hemostasis was achieved with the electrocautery. Copious irrigation was utilized  and was repeatedly aspirated until clear all particulate matter.  Pneumoperitoneum was completely reduced after viewing  removal of the trocars under direct vision. The wound was thoroughly irrigated and the fascia was then closed with a figure of eight suture; the skin was then closed with 4-0 Monocryl and a sterile glue was applied.  Instrument, sponge, and needle counts were correct at closure and at the conclusion of the case.   Findings: Cholecystitis with Cholelithiasis, unable to get dye into duodenum on cholangiogram, concern for large stone as distal CBD  Estimated Blood Loss: 50         Drains: none         Total IV Fluids:         Specimens: Gallbladder           Complications: None; patient tolerated the procedure well.         Disposition: PACU - hemodynamically stable.         Condition: stable

## 2016-01-31 NOTE — H&P (Signed)
Destiny Williamson is an 50 y.o. female.   Chief Complaint: epigastric pain  HPI: 50yrold female with a past medical history of hypothyroidism came in today because of sudden onset of pain in the epigastrium and right upper quadrant starting last night. Patient states that he does not quit since it started. Patient states that these and she would move bumps in the road would make the pain worse. Patient states that she has received some pain medication which did help however the pain is still layer just went from an 8 down to a 3. Patient states that the pain feels as if it moved around both of her sides and through to her back. Patient also states that she does have some pain underneath the right shoulder blade. Patient denies any fever chills. Patient denies ever having pain like this previously. Patient ate calzone prior to the pain. Patient did have some nausea but no vomiting. Patient otherwise denies fever, chills, vomiting, diarrhea, constipation, jaundice, or dysuria.  Past Medical History  Diagnosis Date  . Thyroid disease     Past Surgical History  Procedure Laterality Date  . Toe surgery    . Appendectomy      Family History:  Mother--gallbladder disease;  HTN, DM in grandparents  Social History: Former smoker: smoked about 144yrat 1ppd but quite approximately 20 years ago,  She very rarely drinks EtOH, denies any smokeless tobacco or illicit drugs  Allergies: No Known Allergies   (Not in a hospital admission)  Results for orders placed or performed during the hospital encounter of 01/31/16 (from the past 48 hour(s))  Basic metabolic panel     Status: Abnormal   Collection Time: 01/31/16  6:36 AM  Result Value Ref Range   Sodium 140 135 - 145 mmol/L   Potassium 3.6 3.5 - 5.1 mmol/L   Chloride 107 101 - 111 mmol/L   CO2 24 22 - 32 mmol/L   Glucose, Bld 112 (H) 65 - 99 mg/dL   BUN 23 (H) 6 - 20 mg/dL   Creatinine, Ser 0.98 0.44 - 1.00 mg/dL   Calcium 8.8 (L) 8.9 - 10.3  mg/dL   GFR calc non Af Amer >60 >60 mL/min   GFR calc Af Amer >60 >60 mL/min    Comment: (NOTE) The eGFR has been calculated using the CKD EPI equation. This calculation has not been validated in all clinical situations. eGFR's persistently <60 mL/min signify possible Chronic Kidney Disease.    Anion gap 9 5 - 15  CBC     Status: None   Collection Time: 01/31/16  6:36 AM  Result Value Ref Range   WBC 6.6 3.6 - 11.0 K/uL   RBC 4.77 3.80 - 5.20 MIL/uL   Hemoglobin 13.6 12.0 - 16.0 g/dL   HCT 40.1 35.0 - 47.0 %   MCV 84.1 80.0 - 100.0 fL   MCH 28.5 26.0 - 34.0 pg   MCHC 33.9 32.0 - 36.0 g/dL   RDW 14.1 11.5 - 14.5 %   Platelets 260 150 - 440 K/uL  Troponin I     Status: None   Collection Time: 01/31/16  6:36 AM  Result Value Ref Range   Troponin I <0.03 <0.031 ng/mL    Comment:        NO INDICATION OF MYOCARDIAL INJURY.   Hepatic function panel     Status: Abnormal   Collection Time: 01/31/16  6:36 AM  Result Value Ref Range   Total Protein 7.4 6.5 -  8.1 g/dL   Albumin 4.0 3.5 - 5.0 g/dL   AST 17 15 - 41 U/L   ALT 16 14 - 54 U/L   Alkaline Phosphatase 85 38 - 126 U/L   Total Bilirubin 0.4 0.3 - 1.2 mg/dL   Bilirubin, Direct <0.1 (L) 0.1 - 0.5 mg/dL   Indirect Bilirubin NOT CALCULATED 0.3 - 0.9 mg/dL  Lipase, blood     Status: None   Collection Time: 01/31/16  6:36 AM  Result Value Ref Range   Lipase 31 11 - 51 U/L  Troponin I     Status: None   Collection Time: 01/31/16  9:40 AM  Result Value Ref Range   Troponin I <0.03 <0.031 ng/mL    Comment:        NO INDICATION OF MYOCARDIAL INJURY.    Dg Chest Portable 1 View  01/31/2016  CLINICAL DATA:  Patient woke at 4 a.m. with upper chest pain radiating to the back. Nausea. Former smoker. EXAM: PORTABLE CHEST 1 VIEW COMPARISON:  None. FINDINGS: The heart size and mediastinal contours are within normal limits. Both lungs are clear. The visualized skeletal structures are unremarkable. IMPRESSION: No active disease.  Electronically Signed   By: Lucienne Capers M.D.   On: 01/31/2016 06:54   US Abdomen Limited Ruq  01/31/2016  CLINICAL DATA:  Right upper quadrant pain . EXAM: US ABDOMEN LIMITED - RIGHT UPPER QUADRANT COMPARISON:  CT 12/21/2014 FINDINGS: Gallbladder: Multiple gallstones noted. Multiple appear non mobile. Stones noted within the neck of the gallbladder. The largest measures 1.8 cm gallbladder wall thickness 2.8 mm. No pericholecystic fluid collection. Negative Murphy sign. Common bile duct: Diameter: 14.3 mm. Debris within the distal common bile duct cannot be completely excluded. Liver: Liver is echogenic consistent fatty infiltration and/or hepatocellular disease. IMPRESSION: 1. Multiple gallstones. Some appear non mobile. Gallstones in the neck of the gallbladder. 2. Prominently dilated common bile duct at 14.3 mm. Debris within the distal common bile duct cannot be excluded. MRCP can be obtained as needed. Electronically Signed   By: Marcello Moores  Register   On: 01/31/2016 08:28    Review of Systems  Constitutional: Positive for malaise/fatigue. Negative for fever, chills, weight loss and diaphoresis.  HENT: Negative for congestion and sore throat.   Respiratory: Negative for cough, sputum production and shortness of breath.   Cardiovascular: Negative for chest pain, palpitations and leg swelling.  Gastrointestinal: Positive for nausea, vomiting and abdominal pain. Negative for heartburn, diarrhea, constipation and blood in stool.  Genitourinary: Negative for dysuria, hematuria and flank pain.  Musculoskeletal: Negative for myalgias and back pain.  Skin: Negative for itching and rash.  Neurological: Negative for dizziness, loss of consciousness, weakness and headaches.  Psychiatric/Behavioral: The patient is not nervous/anxious.   All other systems reviewed and are negative.   Blood pressure 129/53, pulse 57, temperature 97.3 F (36.3 C), temperature source Oral, resp. rate 18, height _0  (1.727  m), weight 230 lb (104.327 kg), SpO2 98 %. Physical Exam  Vitals reviewed. Constitutional: She is oriented to person, place, and time. She appears well-developed and well-nourished. No distress.  HENT:  Head: Normocephalic and atraumatic.  Right Ear: External ear normal.  Left Ear: External ear normal.  Nose: Nose normal.  Mouth/Throat: Oropharynx is clear and moist. No oropharyngeal exudate.  Eyes: Conjunctivae and EOM are normal. Pupils are equal, round, and reactive to light. No scleral icterus.  Neck: Normal range of motion. Neck supple. No tracheal deviation present. No thyromegaly present.  Cardiovascular:  Normal rate, regular rhythm and intact distal pulses.  Exam reveals no gallop and no friction rub.   No murmur heard. Respiratory: Effort normal and breath sounds normal. No respiratory distress. She has no wheezes. She has no rales.  GI: Soft. Bowel sounds are normal. She exhibits no distension. There is no rebound and no guarding.  Epigastric to RUQ abdominal pain, + murphy's sign, Right CVA tenderness   Neurological: She is alert and oriented to person, place, and time. No cranial nerve deficit.  Skin: Skin is warm and dry. No rash noted. No erythema. No pallor.  Psychiatric: She has a normal mood and affect. Her behavior is normal. Judgment and thought content normal.     Assessment/Plan 50 year old female with acute cholecystitis and choledocholithiasis. I personally reviewed the patient's past medical history which only has some hypothyroidism.  Personally reviewed her laboratory values which include a normal white blood cell count as well as normal LFTs and bilirubin. I personally reviewed her ultrasound images which do show an enlarged, common bile duct up to 14.3 mm, along with multiple gallstones however without any thickening of the wall or pericholecystic fluid. No reviewed the radiology read as above.  Given the patient's acute onset of symptoms along with a positive  Murphy sign on physical exam do believe that she is suffering from acute cholecystitis however with her enlarged common bile duct, I have some suspicion for choledocholithiasis as well. The risks, benefits, complications, treatment options, and expected outcomes were discussed with the patient and husband. The possibilities of bleeding, recurrent infection, finding a normal gallbladder, perforation of viscus organs, damage to surrounding structures, bile leak, abscess formation, needing a drain placed, the need for additional procedures, reaction to medication, pulmonary aspiration,  failure to diagnose a condition, the possible need to convert to an open procedure, and creating a complication requiring transfusion or operation were discussed with the patient. I also discussed with the patient that given the findings of the enlarged common bile duct that that would be shot through the gallbladder to evaluate for stones and these would be attempted to be flushed out of the time of the operation but if they were unable to do so that she would need a further procedure, ERCP. The patient and husband concurred with the proposed plan, giving informed consent.      Hubbard Robinson, MD 01/31/2016, 1:06 PM

## 2016-01-31 NOTE — ED Provider Notes (Addendum)
[facility] Emergency Department Provider Note  ____________________________________________   I have reviewed the triage vital signs and the nursing notes.   HISTORY  Chief Complaint Chest Pain    HPI Destiny Williamson is a 50 y.o. female who is healthy aside from some thyroid issues, nonsmoker, no personal history of high cholesterol, no recent travel, aside from vaginal cream occasionally not on any eczematous estrogens, who has a family history of CAD in her father and her brother and her brother, her brother died of heart attack at age 254 around Thanksgiving. The patient's brother was a smoker, as were both her parents. In any event, patient presents this morning with what she describes as chest pain but she actually points to the right upper quadrant. Radiates towards the back. Worker from sleep this morning. Mild nausea. No shortness of breath. Not pleuritic. No recent leg swelling, denies fever or chills. No vomiting. No diarrhea. Has not had this pain before. Pain is worse when she lies back and better when she sits up. It is a sharp/pressure-like discomfort. She denies any exertional symptoms prior to this. Pain woke her up at 4:00 in the morning as been present ever since. No other associated symptoms besides those mentioned.     Past Medical History  Diagnosis Date  . Thyroid disease     Patient Active Problem List   Diagnosis Date Noted  . Fatigue 02/17/2014  . Adiposity 02/17/2014  . Extremity pain 11/28/2011  . FOM (frequency of micturition) 11/28/2011  . Adult hypothyroidism 10/31/2011    Past Surgical History  Procedure Laterality Date  . Toe surgery    . Appendectomy      Current Outpatient Rx  Name  Route  Sig  Dispense  Refill  . conjugated estrogens (PREMARIN) vaginal cream   Vaginal   Place vaginally.         Marland Kitchen. levonorgestrel (MIRENA) 20 MCG/24HR IUD   Intrauterine   by Intrauterine route.         Marland Kitchen. EXPIRED:  levothyroxine (SYNTHROID) 125 MCG tablet   Oral   Take by mouth.         . levothyroxine (SYNTHROID, LEVOTHROID) 125 MCG tablet   Oral   Take by mouth.           Allergies Review of patient's allergies indicates no known allergies.  No family history on file.  Social History Social History  Substance Use Topics  . Smoking status: Former Games developermoker  . Smokeless tobacco: None  . Alcohol Use: None    Review of Systems Constitutional: No fever/chills Eyes: No visual changes. ENT: No sore throat. No stiff neck no neck pain Cardiovascular:See history of present illness Respiratory: Denies shortness of breath. Gastrointestinal:   no vomiting.  No diarrhea.  No constipation. Genitourinary: Negative for dysuria. Musculoskeletal: Negative lower extremity swelling Skin: Negative for rash. Neurological: Negative for headaches, focal weakness or numbness. 10-point ROS otherwise negative.  ____________________________________________   PHYSICAL EXAM:  VITAL SIGNS: ED Triage Vitals  Enc Vitals Group     BP 01/31/16 0634 160/87 mmHg     Pulse Rate 01/31/16 0634 63     Resp 01/31/16 0634 20     Temp 01/31/16 0634 97.3 F (36.3 C)     Temp Source 01/31/16 0634 Oral     SpO2 01/31/16 0634 100 %     Weight 01/31/16 0634 230 lb (104.327 kg)     Height 01/31/16 0634 5\' 8"  (1.727 m)  Head Cir --      Peak Flow --      Pain Score 01/31/16 0632 7     Pain Loc --      Pain Edu? --      Excl. in GC? --     Constitutional: Alert and oriented. Well appearing and in no acute distress.Patient is very very anxious initially Eyes: Conjunctivae are normal. PERRL. EOMI. Head: Atraumatic. Nose: No congestion/rhinnorhea. Mouth/Throat: Mucous membranes are moist.  Oropharynx non-erythematous. Neck: No stridor.   Nontender with no meningismus Cardiovascular: Normal rate, regular rhythm. Grossly normal heart sounds.  Good peripheral circulation. Respiratory: Normal respiratory effort.   No retractions. Lungs CTAB. Abdominal: Tenderness to palpation in the right upper quadrant which reproduces the patient's discomfort, nonsurgical abdomen. No AAA or mass palpated No distention. No guarding no rebound Back:  There is no focal tenderness or step off there is no midline tenderness there are no lesions noted. there is no CVA tenderness Musculoskeletal: No lower extremity tenderness. No joint effusions, no DVT signs strong distal pulses no edema Neurologic:  Normal speech and language. No gross focal neurologic deficits are appreciated.  Skin:  Skin is warm, dry and intact. No rash noted. Psychiatric: Mood and affect are anxious. Speech and behavior are normal.  ____________________________________________   LABS (all labs ordered are listed, but only abnormal results are displayed)  Labs Reviewed  BASIC METABOLIC PANEL - Abnormal; Notable for the following:    Glucose, Bld 112 (*)    BUN 23 (*)    Calcium 8.8 (*)    All other components within normal limits  CBC  TROPONIN I  HEPATIC FUNCTION PANEL  LIPASE, BLOOD   ____________________________________________  EKG  I personally interpreted any EKGs ordered by me or triage Normal sinus rhythm rate 63 bpm no acute ST elevation or acute ST depression normal axis unremarkable EKG ____________________________________________  RADIOLOGY  I reviewed any imaging ordered by me or triage that were performed during my shift and, if possible, patient and/or family made aware of any abnormal findings. ____________________________________________   PROCEDURES  Procedure(s) performed: None  Critical Care performed: None  ____________________________________________   INITIAL IMPRESSION / ASSESSMENT AND PLAN / ED COURSE  Pertinent labs & imaging results that were available during my care of the patient were reviewed by me and considered in my medical decision making (see chart for details).  The patient has  reproducible right upper quadrant pain, at this time is no indication to suspect this is acute coronary syndrome, she has had ongoing uninterrupted pain at rest starting at 4 this morning with a normal EKG and negative troponin. However we will check a repeat troponin as a precaution. More concerning however is her very reproducible right upper quadrant pain which is certainly not inconsistent with gallbladder disease or reflux disease. She does have minimal epigastric pain as well. We will obtain ultrasound give her pain medications and nausea medication. Patient is very reassured at this time and one can see as she calms down her blood pressure trending and a positive direction. We will monitor her closely in the emergency department, very low suspicion for PE for this very reproducible nonpleuritic pain not associated with shortness of breath. Aside from vaginal cream which may or may not be a risk factor for PE and I suspect isn't, patient actually is perk negative.  ----------------------------------------- 8:53 AM on 01/31/2016 -----------------------------------------  Patient workup. Has minor pain but not as much as before the morphine, she does  have was very classically biliary colic with gallstones including one may be stuck in the neck. We're paging surgery, made patient aware of the findings.  Dr. Ludwig Lean kind enough to respond to our consult and graciously agrees to admit the patient. ____________________________________________   FINAL CLINICAL IMPRESSION(S) / ED DIAGNOSES  Final diagnoses:  RUQ pain      This chart was dictated using voice recognition software.  Despite best efforts to proofread,  errors can occur which can change meaning.     Jeanmarie Plant, MD 01/31/16 1191  Jeanmarie Plant, MD 01/31/16 4782  Jeanmarie Plant, MD 01/31/16 [phone]

## 2016-02-01 ENCOUNTER — Encounter
Admission: EM | Disposition: A | Discharge status: Home / Self Care | Payer: Self-pay | Source: Home / Self Care | Attending: Emergency Medicine

## 2016-02-01 ENCOUNTER — Encounter: Payer: Self-pay | Admitting: Surgery

## 2016-02-01 ENCOUNTER — Observation Stay: Payer: 59 | Admitting: Anesthesiology

## 2016-02-01 DIAGNOSIS — R932 Abnormal findings on diagnostic imaging of liver and biliary tract: Secondary | ICD-10-CM | POA: Diagnosis not present

## 2016-02-01 DIAGNOSIS — K8050 Calculus of bile duct without cholangitis or cholecystitis without obstruction: Secondary | ICD-10-CM | POA: Diagnosis not present

## 2016-02-01 DIAGNOSIS — K838 Other specified diseases of biliary tract: Secondary | ICD-10-CM

## 2016-02-01 DIAGNOSIS — K8040 Calculus of bile duct with cholecystitis, unspecified, without obstruction: Secondary | ICD-10-CM | POA: Diagnosis not present

## 2016-02-01 HISTORY — PX: ENDOSCOPIC RETROGRADE CHOLANGIOPANCREATOGRAPHY (ERCP) WITH PROPOFOL: SHX5810

## 2016-02-01 LAB — CBC
HEMATOCRIT: 36.7 % (ref 35.0–47.0)
Hemoglobin: 12.7 g/dL (ref 12.0–16.0)
MCH: 29.3 pg (ref 26.0–34.0)
MCHC: 34.6 g/dL (ref 32.0–36.0)
MCV: 84.6 fL (ref 80.0–100.0)
Platelets: 255 10*3/uL (ref 150–440)
RBC: 4.34 MIL/uL (ref 3.80–5.20)
RDW: 14.2 % (ref 11.5–14.5)
WBC: 6.5 10*3/uL (ref 3.6–11.0)

## 2016-02-01 LAB — COMPREHENSIVE METABOLIC PANEL
ALBUMIN: 3.5 g/dL (ref 3.5–5.0)
ALT: 47 U/L (ref 14–54)
AST: 49 U/L — AB (ref 15–41)
Alkaline Phosphatase: 73 U/L (ref 38–126)
Anion gap: 9 (ref 5–15)
BUN: 12 mg/dL (ref 6–20)
CALCIUM: 8.4 mg/dL — AB (ref 8.9–10.3)
CO2: 20 mmol/L — AB (ref 22–32)
Chloride: 110 mmol/L (ref 101–111)
Creatinine, Ser: 0.64 mg/dL (ref 0.44–1.00)
GFR calc Af Amer: >60 mL/min (ref >60)
GFR calc non Af Amer: >60 mL/min (ref >60)
GLUCOSE: 179 mg/dL — AB (ref 65–99)
Potassium: 3.9 mmol/L (ref 3.5–5.1)
SODIUM: 139 mmol/L (ref 135–145)
TOTAL PROTEIN: 6.8 g/dL (ref 6.5–8.1)
Total Bilirubin: 0.5 mg/dL (ref 0.3–1.2)

## 2016-02-01 SURGERY — ENDOSCOPIC RETROGRADE CHOLANGIOPANCREATOGRAPHY (ERCP) WITH PROPOFOL
Anesthesia: General

## 2016-02-01 MED ORDER — INDOMETHACIN 50 MG RE SUPP
100.0000 mg | Freq: Once | RECTAL | Status: AC
  Administered 2016-02-01: 100 mg via RECTAL
  Filled 2016-02-01: qty 2

## 2016-02-01 MED ORDER — MIDAZOLAM HCL 2 MG/2ML IJ SOLN
INTRAMUSCULAR | Status: DC | PRN
  Administered 2016-02-01: 1 mg via INTRAVENOUS

## 2016-02-01 MED ORDER — SODIUM CHLORIDE 0.9 % IV SOLN
INTRAVENOUS | Status: DC
  Administered 2016-02-01: 11:00:00 via INTRAVENOUS

## 2016-02-01 MED ORDER — GLUCAGON HCL RDNA (DIAGNOSTIC) 1 MG IJ SOLR
INTRAMUSCULAR | Status: AC
  Administered 2016-02-01: 12:00:00
  Filled 2016-02-01: qty 1

## 2016-02-01 MED ORDER — OXYCODONE HCL 5 MG PO TABS: 5.0000 mg | ORAL_TABLET | ORAL | Status: DC | PRN

## 2016-02-01 MED ORDER — PROPOFOL 500 MG/50ML IV EMUL
INTRAVENOUS | Status: DC | PRN
  Administered 2016-02-01: 120 ug/kg/min via INTRAVENOUS

## 2016-02-01 MED ORDER — FENTANYL CITRATE (PF) 100 MCG/2ML IJ SOLN
INTRAMUSCULAR | Status: DC | PRN
  Administered 2016-02-01: 50 ug via INTRAVENOUS

## 2016-02-01 NOTE — Progress Notes (Signed)
Day of Surgery  Subjective: Patient had ERCP today with sludge extraction and stent placement She is tolerating a clear liquid diet  Objective: Vital signs in last 24 hours: Temp:  [96.4 F (35.8 C)-99.1 F (37.3 C)] 97.4 F (36.3 C) (05/03 1358) Pulse Rate:  [52-84] 52 (05/03 1358) Resp:  [12-23] 18 (05/03 1358) BP: (112-168)/(55-85) 168/76 mmHg (05/03 1358) SpO2:  [94 %-100 %] 99 % (05/03 1358) Weight:  [230 lb (104.327 kg)] 230 lb (104.327 kg) (05/03 1053) Last BM Date: 01/30/16  Intake/Output from previous day: 05/02 0701 - 05/03 0700 In: 2565 [I.V.:2565] Out: 1600 [Urine:1550; Blood:50] Intake/Output this shift: Total I/O In: 900 [I.V.:900] Out: 0   Physical exam:  No icterus no jaundice soft nontender abdomen wounds clean.  Lab Results: CBC   Recent Labs  01/31/16 0636 02/01/16 0340  WBC 6.6 6.5  HGB 13.6 12.7  HCT 40.1 36.7  PLT 260 255   BMET  Recent Labs  01/31/16 0636 02/01/16 0340  NA 140 139  K 3.6 3.9  CL 107 110  CO2 24 20*  GLUCOSE 112* 179*  BUN 23* 12  CREATININE 0.98 0.64  CALCIUM 8.8* 8.4*   PT/INR No results for input(s): LABPROT, INR in the last 72 hours. ABG No results for input(s): PHART, HCO3 in the last 72 hours.  Invalid input(s): PCO2, PO2  Studies/Results: Dg Cholangiogram Operative  01/31/2016  CLINICAL DATA:  Cholecystitis. EXAM: INTRAOPERATIVE CHOLANGIOGRAM TECHNIQUE: Cholangiographic images from the C-arm fluoroscopic device were submitted for interpretation post-operatively. Please see the procedural report for the amount of contrast and the fluoroscopy time utilized. COMPARISON:  Ultrasound 01/31/2016 FINDINGS: Contrast opacification of the common bile duct, common hepatic duct and intrahepatic ducts. There is dilatation of the biliary system, particularly the common bile duct. No large filling defects but contrast does not drain into the duodenum. IMPRESSION: Dilated intrahepatic and extrahepatic biliary system  without filling defects but no drainage into the duodenum. Findings raise concern for a distal common bile duct obstruction. Consider further evaluation with MRCP or ERCP. Electronically Signed   By: Richarda Overlie M.D.   On: 01/31/2016 17:53   Dg Chest Portable 1 View  01/31/2016  CLINICAL DATA:  Patient woke at 4 a.m. with upper chest pain radiating to the back. Nausea. Former smoker. EXAM: PORTABLE CHEST 1 VIEW COMPARISON:  None. FINDINGS: The heart size and mediastinal contours are within normal limits. Both lungs are clear. The visualized skeletal structures are unremarkable. IMPRESSION: No active disease. Electronically Signed   By: Burman Nieves M.D.   On: 01/31/2016 06:54   US Abdomen Limited Ruq  01/31/2016  CLINICAL DATA:  Right upper quadrant pain . EXAM: US ABDOMEN LIMITED - RIGHT UPPER QUADRANT COMPARISON:  CT 12/21/2014 FINDINGS: Gallbladder: Multiple gallstones noted. Multiple appear non mobile. Stones noted within the neck of the gallbladder. The largest measures 1.8 cm gallbladder wall thickness 2.8 mm. No pericholecystic fluid collection. Negative Murphy sign. Common bile duct: Diameter: 14.3 mm. Debris within the distal common bile duct cannot be completely excluded. Liver: Liver is echogenic consistent fatty infiltration and/or hepatocellular disease. IMPRESSION: 1. Multiple gallstones. Some appear non mobile. Gallstones in the neck of the gallbladder. 2. Prominently dilated common bile duct at 14.3 mm. Debris within the distal common bile duct cannot be excluded. MRCP can be obtained as needed. Electronically Signed   By: Maisie Fus  Register   On: 01/31/2016 08:28    Anti-infectives: Anti-infectives    Start     Dose/Rate Route  Frequency Ordered Stop   01/31/16 1030  cefOXitin (MEFOXIN) injection 2 g  Status:  Discontinued     2 g Intravenous  Once 01/31/16 1021 01/31/16 1027   01/31/16 1030  cefOXitin (MEFOXIN) 2 g in dextrose 5 % 50 mL IVPB     2 g 100 mL/hr over 30 Minutes  Intravenous  Once 01/31/16 1027 01/31/16 1138      Assessment/Plan: s/p Procedure(s): ENDOSCOPIC RETROGRADE CHOLANGIOPANCREATOGRAPHY (ERCP) WITH PROPOFOL   Status post ERCP for retained stones sludge being removed. She is doing well and will likely be able to go home tomorrow  Destiny Hawichard E Emilygrace Grothe, MD, FACS  02/01/2016

## 2016-02-01 NOTE — Anesthesia Preprocedure Evaluation (Addendum)
Anesthesia Evaluation  Patient identified by MRN, date of birth, ID band Patient awake    Reviewed: Allergy & Precautions, H&P , NPO status , Patient's Chart, lab work & pertinent test results  History of Anesthesia Complications Negative for: history of anesthetic complications  Airway Mallampati: III  TM Distance: >3 FB Neck ROM: full    Dental  (+) Poor Dentition, Chipped   Pulmonary neg shortness of breath, former smoker,    Pulmonary exam normal breath sounds clear to auscultation       Cardiovascular Exercise Tolerance: Good (-) angina(-) Past MI and (-) DOE negative cardio ROS Normal cardiovascular exam Rhythm:regular Rate:Normal     Neuro/Psych negative neurological ROS  negative psych ROS   GI/Hepatic negative GI ROS, Neg liver ROS, neg GERD  ,  Endo/Other  Hypothyroidism   Renal/GU negative Renal ROS  negative genitourinary   Musculoskeletal   Abdominal   Peds  Hematology negative hematology ROS (+)   Anesthesia Other Findings Past Medical History:   Thyroid disease                                             Past Surgical History:   TOE SURGERY                                                   APPENDECTOMY                                                 BMI    Body Mass Index   34.97 kg/m 2      Reproductive/Obstetrics negative OB ROS                             Anesthesia Physical  Anesthesia Plan  ASA: III  Anesthesia Plan: General   Post-op Pain Management:    Induction:   Airway Management Planned:   Additional Equipment:   Intra-op Plan:   Post-operative Plan:   Informed Consent: I have reviewed the patients History and Physical, chart, labs and discussed the procedure including the risks, benefits and alternatives for the proposed anesthesia with the patient or authorized representative who has indicated his/her understanding and acceptance.    Dental Advisory Given  Plan Discussed with: Anesthesiologist, CRNA and Surgeon  Anesthesia Plan Comments:         Anesthesia Quick Evaluation  

## 2016-02-01 NOTE — Anesthesia Procedure Notes (Signed)
Performed by: COOK-MARTIN, [facility] Pre-anesthesia Checklist: Patient identified, Emergency Drugs available, Suction available, Patient being monitored and Timeout performed Patient Re-evaluated:Patient Re-evaluated prior to inductionOxygen Delivery Method: Nasal cannula Preoxygenation: Pre-oxygenation with 100% oxygen Intubation Type: IV induction Airway Equipment and Method: Bite block Placement Confirmation: CO2 detector and positive ETCO2     

## 2016-02-01 NOTE — Anesthesia Postprocedure Evaluation (Signed)
Anesthesia Post Note  Patient: Destiny Williamson  Procedure(s) Performed: Procedure(s) (LRB): ENDOSCOPIC RETROGRADE CHOLANGIOPANCREATOGRAPHY (ERCP) WITH PROPOFOL (N/A)  Patient location during evaluation: Endoscopy Anesthesia Type: General Level of consciousness: awake and alert Pain management: pain level controlled Vital Signs Assessment: post-procedure vital signs reviewed and stable Respiratory status: spontaneous breathing, nonlabored ventilation, respiratory function stable and patient connected to nasal cannula oxygen Cardiovascular status: blood pressure returned to baseline and stable Postop Assessment: no signs of nausea or vomiting Anesthetic complications: no    Last Vitals:  Filed Vitals:   02/01/16 1340 02/01/16 1358  BP: 131/74 168/76  Pulse: 65 52  Temp:  36.3 C  Resp: 22 18    Last Pain:  Filed Vitals:   02/01/16 1449  PainSc: 4                  Lenard SimmerAndrew Ashira Kirsten

## 2016-02-01 NOTE — Transfer of Care (Signed)
Immediate Anesthesia Transfer of Care Note  Patient: Destiny Williamson  Procedure(s) Performed: Procedure(s): ENDOSCOPIC RETROGRADE CHOLANGIOPANCREATOGRAPHY (ERCP) WITH PROPOFOL (N/A)  Patient Location: PACU  Anesthesia Type:General  Level of Consciousness: awake, oriented and sedated  Airway & Oxygen Therapy: Patient Spontanous Breathing and Patient connected to nasal cannula oxygen  Post-op Assessment: Report given to RN and Post -op Vital signs reviewed and stable  Post vital signs: Reviewed and stable  Last Vitals:  Filed Vitals:   02/01/16 0857 02/01/16 1053  BP: 119/64 120/64  Pulse: 56 61  Temp: 36.8 C 36.5 C  Resp: 16 16    Last Pain:  Filed Vitals:   02/01/16 1055  PainSc: Asleep         Complications: No apparent anesthesia complications

## 2016-02-01 NOTE — Op Note (Signed)
[facility] Gastroenterology Patient Name: Destiny Williamson Procedure Date: 02/01/2016 11:28 AM MRN: [id number] Account #: [id number] Date of Birth: November 06, 1965 Admit Type: Inpatient Age: 50 [facility] ROOM 4 Gender: Female Note Status: Finalized Procedure:            ERCP Indications:          Abnormal intraoperative cholangiogram Providers:            Midge Miniumarren Jaymir Struble, MD Medicines:            Propofol per Anesthesia Complications:        No immediate complications. Procedure:            Pre-Anesthesia Assessment:                       - Prior to the procedure, a History and Physical was                        performed, and patient medications and allergies were                        reviewed. The patient's tolerance of previous                        anesthesia was also reviewed. The risks and benefits of                        the procedure and the sedation options and risks were                        discussed with the patient. All questions were                        answered, and informed consent was obtained. Prior                        Anticoagulants: The patient has taken no previous                        anticoagulant or antiplatelet agents. ASA Grade                        Assessment: II - A patient with mild systemic disease.                        After reviewing the risks and benefits, the patient was                        deemed in satisfactory condition to undergo the                        procedure.                       After obtaining informed consent, the scope was passed                        under direct vision. Throughout the procedure, the                        patient's blood pressure, pulse, and oxygen  saturations                        were monitored continuously. The Enteroscope was                        introduced through the mouth, and used to inject                        contrast into and used to inject contrast into the bile                         duct and ventral pancreatic duct. The ERCP was                        accomplished without difficulty. The patient tolerated                        the procedure well. Findings:      The scout film was normal. The esophagus was successfully intubated       under direct vision. The scope was advanced to a normal major papilla in       the descending duodenum without detailed examination of the pharynx,       larynx and associated structures, and upper GI tract. The upper GI tract       was grossly normal. The bile duct could not be cannulated. The ventral       pancreatic duct was deeply cannulated. Contrast was injected. I       personally interpreted the pancreatic duct images. A wire was passed       into the ventral pancreatic duct. 5 Fr by 5 cm plastic stents were       placed into the ventral pancreatic duct. Fluid flowed through the       stents. The stents were in good position. A biliary pre-cut       sphincterotomy measuring 4 mm in length was made with a needle knife       over a pancreatic stent using ERBE electrocautery. There was no       post-sphincterotomy bleeding. The bile duct was deeply cannulated with       the short-nosed traction sphincterotome. Contrast was injected. The       common bile duct was diffusely dilated. To discover objects, the biliary       tree was swept with a 15 mm balloon starting at the bifurcation. Sludge       was swept from the duct. Impression:           - The common bile duct was dilated.                       - Plastic stents were placed into the ventral                        pancreatic duct.                       - A biliary sphincterotomy was performed.                       - The biliary tree was swept and sludge was found. Recommendation:       -  Repeat ERCP in 2 weeks to remove stent. Procedure Code(s):    --- Professional ---                       [phone], Endoscopic retrograde cholangiopancreatography                         (ERCP); with placement of endoscopic stent into biliary                        or pancreatic duct, including pre- and post-dilation                        and guide wire passage, when performed, including                        sphincterotomy, when performed, each stent                       [id number], Endoscopic retrograde cholangiopancreatography                        (ERCP); with removal of calculi/debris from                        biliary/pancreatic duct(s)                       [phone], Endoscopic catheterization of the pancreatic                        ductal system, radiological supervision and                        interpretation Diagnosis Code(s):    --- Professional ---                       K83.8, Other specified diseases of biliary tract                       R93.2, Abnormal findings on diagnostic imaging of liver                        and biliary tract CPT copyright 2016 American Medical Association. All rights reserved. The codes documented in this report are preliminary and upon coder review may  be revised to meet current compliance requirements. Midge Minium, MD 02/01/2016 1:05:20 PM This report has been signed electronically. Number of Addenda: 0 Note Initiated On: 02/01/2016 11:28 AM      [facility]

## 2016-02-02 ENCOUNTER — Telehealth: Payer: Self-pay

## 2016-02-02 LAB — SURGICAL PATHOLOGY

## 2016-02-02 NOTE — Telephone Encounter (Signed)
Patient called in to make follow-up with Dr. Servando SnareWohl status post ERCP with stent placement. Spoke with Dr. Servando SnareWohl. Patient needs to follow-up for Stent removal in 2 weeks. Please schedule, patient has not preferences for scheduling.

## 2016-02-02 NOTE — Progress Notes (Signed)
1 Day Post-Op  Subjective: She feels well today and wants to go home. She is tolerating a diet.  Objective: Vital signs in last 24 hours: Temp:  [96.4 F (35.8 C)-98 F (36.7 C)] 98 F (36.7 C) (05/04 0446) Pulse Rate:  [52-84] 58 (05/04 0446) Resp:  [15-23] 18 (05/04 0446) BP: (117-168)/(59-76) 120/61 mmHg (05/04 0446) SpO2:  [95 %-100 %] 96 % (05/04 0446) Last BM Date: 01/30/16  Intake/Output from previous day: 05/03 0701 - 05/04 0700 In: 4069 [P.O.:750; I.V.:3319] Out: 2200 [Urine:2200] Intake/Output this shift: Total I/O In: 796 [P.O.:660; I.V.:136] Out: 700 [Urine:700]  Physical exam:  No icterus no jaundice abdomen soft nontender patient appears comfortable wounds are clean  Lab Results: CBC   Recent Labs  01/31/16 0636 02/01/16 0340  WBC 6.6 6.5  HGB 13.6 12.7  HCT 40.1 36.7  PLT 260 255   BMET  Recent Labs  01/31/16 0636 02/01/16 0340  NA 140 139  K 3.6 3.9  CL 107 110  CO2 24 20*  GLUCOSE 112* 179*  BUN 23* 12  CREATININE 0.98 0.64  CALCIUM 8.8* 8.4*   PT/INR No results for input(s): LABPROT, INR in the last 72 hours. ABG No results for input(s): PHART, HCO3 in the last 72 hours.  Invalid input(s): PCO2, PO2  Studies/Results: Dg Cholangiogram Operative  01/31/2016  CLINICAL DATA:  Cholecystitis. EXAM: INTRAOPERATIVE CHOLANGIOGRAM TECHNIQUE: Cholangiographic images from the C-arm fluoroscopic device were submitted for interpretation post-operatively. Please see the procedural report for the amount of contrast and the fluoroscopy time utilized. COMPARISON:  Ultrasound 01/31/2016 FINDINGS: Contrast opacification of the common bile duct, common hepatic duct and intrahepatic ducts. There is dilatation of the biliary system, particularly the common bile duct. No large filling defects but contrast does not drain into the duodenum. IMPRESSION: Dilated intrahepatic and extrahepatic biliary system without filling defects but no drainage into the  duodenum. Findings raise concern for a distal common bile duct obstruction. Consider further evaluation with MRCP or ERCP. Electronically Signed   By: Richarda OverlieAdam  Henn M.D.   On: 01/31/2016 17:53    Anti-infectives: Anti-infectives    Start     Dose/Rate Route Frequency Ordered Stop   01/31/16 1030  cefOXitin (MEFOXIN) injection 2 g  Status:  Discontinued     2 g Intravenous  Once 01/31/16 1021 01/31/16 1027   01/31/16 1030  cefOXitin (MEFOXIN) 2 g in dextrose 5 % 50 mL IVPB     2 g 100 mL/hr over 30 Minutes Intravenous  Once 01/31/16 1027 01/31/16 1138      Assessment/Plan: s/p Procedure(s): ENDOSCOPIC RETROGRADE CHOLANGIOPANCREATOGRAPHY (ERCP) WITH PROPOFOL   Patient doing well recommend discharge on a regular diet follow-up in our office in 10 days she patient may shower  Lattie Hawichard E Ignatz Deis, MD, FACS  02/02/2016

## 2016-02-02 NOTE — Progress Notes (Signed)
Pt A and O x 4. VSS. Pt tolerating diet well. No complaints of pain or nausea. IV removed intact, prescriptions given. Pt voiced understanding of discharge instructions with no further questions. Pt will call follow-ups as office appears to be closed for lunch. Work note provided to pt. Pt discharged via wheelchair with axillary.

## 2016-02-02 NOTE — Discharge Summary (Signed)
Physician Discharge Summary  Patient ID: Destiny Williamson MRN: [id number] DOB/AGE: April 17, 1966 50 y.o.  Admit date: 01/31/2016 Discharge date: 02/02/2016   Discharge Diagnoses:  Active Problems:   Choledocholithiasis with acute cholecystitis   Biliary colic   Other specified diseases of biliary tract   Common bile duct filling defect, non-specific   Procedures:Upper scopic cholecystectomy ERCP   Hospital Course:  this a patient with choledocholithiasis he was taken the operating room where laparoscopic cholecystectomy and cholangiography was performed postoperatively she had signs of retained stone and underwent an ERCP after consultation with GI Dr. Servando SnareWohl this resulted in removal of sludge. She made an uncomplicated recovery and is discharged in stable condition to follow-up in our office in 10 days she also will follow up with Dr. Servando SnareWohl in 2 weeks for stent removal.  CoGI  Disposition:      Medication List    TAKE these medications        levonorgestrel 20 MCG/24HR IUD  Commonly known as:  MIRENA  by Intrauterine route.     levothyroxine 125 MCG tablet  Commonly known as:  SYNTHROID, LEVOTHROID  Take 125 mcg by mouth daily before breakfast.     oxyCODONE 5 MG immediate release tablet  Commonly known as:  Oxy IR/ROXICODONE  Take 1-2 tablets (5-10 mg total) by mouth every 4 (four) hours as needed for moderate pain.         Lattie Hawichard E Taniyah Ballow, MD, FACS

## 2016-02-03 ENCOUNTER — Encounter: Payer: Self-pay | Admitting: Gastroenterology

## 2016-02-03 NOTE — Telephone Encounter (Signed)
Pt returned call regarding setting up ERCP on 02/17/16. Trish with Endo will call back Monday once she gets [facility] to set up a time.

## 2016-02-03 NOTE — Telephone Encounter (Signed)
LVM for pt to return my call regarding scheduling repeat ERCP on 02/17/16.

## 2016-02-09 ENCOUNTER — Ambulatory Visit (INDEPENDENT_AMBULATORY_CARE_PROVIDER_SITE_OTHER): Payer: 59 | Admitting: General Surgery

## 2016-02-09 ENCOUNTER — Telehealth: Payer: Self-pay

## 2016-02-09 ENCOUNTER — Encounter: Payer: Self-pay | Admitting: General Surgery

## 2016-02-09 VITALS — BP 129/72 | HR 76 | Temp 97.9°F | Ht 68.0 in | Wt 244.0 lb

## 2016-02-09 DIAGNOSIS — Z4889 Encounter for other specified surgical aftercare: Secondary | ICD-10-CM

## 2016-02-09 NOTE — Telephone Encounter (Signed)
Can you please schedule patient's ERCP. She stated that you were going to call her with this information. Thank you!

## 2016-02-09 NOTE — Patient Instructions (Signed)
Please call our office if you have questions or concerns.   

## 2016-02-09 NOTE — Anesthesia Postprocedure Evaluation (Signed)
Anesthesia Post Note  Patient: Destiny Williamson  Procedure(s) Performed: Procedure(s) (LRB): LAPAROSCOPIC CHOLECYSTECTOMY WITH INTRAOPERATIVE CHOLANGIOGRAM (N/A)  Patient location during evaluation: PACU Anesthesia Type: General Level of consciousness: awake and alert Pain management: pain level controlled Vital Signs Assessment: post-procedure vital signs reviewed and stable Respiratory status: spontaneous breathing, nonlabored ventilation, respiratory function stable and patient connected to nasal cannula oxygen Cardiovascular status: blood pressure returned to baseline and stable Postop Assessment: no signs of nausea or vomiting Anesthetic complications: no    Last Vitals:  Filed Vitals:   02/01/16 2119 02/02/16 0446  BP: 117/59 120/61  Pulse: 60 58  Temp: 36.7 C 36.7 C  Resp: 20 18    Last Pain:  Filed Vitals:   02/02/16 0446  PainSc: 3                  Yevette EdwardsJames G Adams

## 2016-02-09 NOTE — Progress Notes (Signed)
Outpatient Surgical Follow Up  02/09/2016  Bradd BurnerRuth Bernice Colarusso is an 50 y.o. female.   Chief Complaint  Patient presents with  . Routine Post Op    Laparascopic Cholecystectomy    HPI: 50 year old female returns to clinic for follow-up 9 days status post laparoscopic cholecystectomy. Patient reports doing very well. She is eating well, having normal bowel function, not having any pain. She denies any fevers, chills, nausea, vomiting, chest pain, shortness breath, diarrhea, constipation. She's been very happy with her surgical clearance.  Past Medical History  Diagnosis Date  . Thyroid disease     Past Surgical History  Procedure Laterality Date  . Toe surgery    . Appendectomy    . Cholecystectomy N/A 01/31/2016    Procedure: LAPAROSCOPIC CHOLECYSTECTOMY WITH INTRAOPERATIVE CHOLANGIOGRAM;  Surgeon: Gladis Riffleatherine L Loflin, MD;  Location: [facility] ORS;  Service: General;  Laterality: N/A;  . Endoscopic retrograde cholangiopancreatography (ercp) with propofol N/A 02/01/2016    Procedure: ENDOSCOPIC RETROGRADE CHOLANGIOPANCREATOGRAPHY (ERCP) WITH PROPOFOL;  Surgeon: Midge Miniumarren Wohl, MD;  Location: [facility] ENDOSCOPY;  Service: Endoscopy;  Laterality: N/A;    History reviewed. No pertinent family history.  Social History:  reports that she has quit smoking. She does not have any smokeless tobacco history on file. Her alcohol and drug histories are not on file.  Allergies: No Known Allergies  Medications reviewed.    ROS A multipoint review of systems was completed. All pertinent positives and negatives are documented within the history of present illness and the remainder are negative   BP 129/72 mmHg  Pulse 76  Temp(Src) 97.9 F (36.6 C) (Oral)  Ht 5\' 8"  (1.727 m)  Wt 110.678 kg (244 lb)  BMI 37.11 kg/m2  Physical Exam  Gen.: No acute distress Chest: Clear to auscultation Heart: Regular rate and rhythm Abdomen: Soft, nontender, nondistended. Well approximated laparoscopic  cholecystectomy incision sites with LiquiBand in place. Umbilical trocar site with dried blood around the liquid band. Liquid band removed revealing evidence of previous bleed. There is also resolving ecchymosis around the umbilical site with no evidence of erythema or periods.   No results found for this or any previous visit (from the past 48 hour(s)). No results found.  Assessment/Plan:  1. Aftercare following surgery 50 year old female status post laparoscopic cholecystectomy. Pathology reviewed with the patient. Discussed standard postoperative care instructions to include anticipated return to activity and wound care precautions. Specifically discussed the signs and symptoms of infection from any of her trocar sites and she voiced understanding. She will follow-up in clinic on an as-needed basis.     Ricarda Frameharles Nitya Cauthon, MD FACS General Surgeon  02/09/2016,4:07 PM

## 2016-02-10 ENCOUNTER — Telehealth: Payer: Self-pay | Admitting: Gastroenterology

## 2016-02-10 NOTE — Telephone Encounter (Signed)
Patient called and wanted to know if her ERCP was scheduled for next Friday?  Please call

## 2016-02-13 ENCOUNTER — Other Ambulatory Visit: Payer: Self-pay

## 2016-02-13 NOTE — Telephone Encounter (Signed)
Duplicate message. Pt has already been notified of ERCP.

## 2016-02-13 NOTE — Telephone Encounter (Signed)
Duplicate message. Pt scheduled for ERCP on Friday, May 19th. Gave pt number to Endo unit to call for arrival time to call on Thursday.

## 2016-02-17 ENCOUNTER — Encounter: Payer: Self-pay | Admitting: Anesthesiology

## 2016-02-17 ENCOUNTER — Ambulatory Visit: Payer: 59 | Admitting: Certified Registered"

## 2016-02-17 ENCOUNTER — Ambulatory Visit
Admission: RE | Admit: 2016-02-17 | Discharge: 2016-02-17 | Disposition: A | Discharge status: Home / Self Care | Payer: 59 | Source: Ambulatory Visit | Attending: Gastroenterology | Admitting: Gastroenterology

## 2016-02-17 ENCOUNTER — Encounter
Admission: RE | Disposition: A | Discharge status: Home / Self Care | Payer: Self-pay | Source: Ambulatory Visit | Attending: Gastroenterology

## 2016-02-17 DIAGNOSIS — Z79899 Other long term (current) drug therapy: Secondary | ICD-10-CM | POA: Insufficient documentation

## 2016-02-17 DIAGNOSIS — E079 Disorder of thyroid, unspecified: Secondary | ICD-10-CM | POA: Insufficient documentation

## 2016-02-17 DIAGNOSIS — Z87891 Personal history of nicotine dependence: Secondary | ICD-10-CM | POA: Insufficient documentation

## 2016-02-17 DIAGNOSIS — Z4659 Encounter for fitting and adjustment of other gastrointestinal appliance and device: Secondary | ICD-10-CM

## 2016-02-17 HISTORY — PX: ERCP: SHX5425

## 2016-02-17 SURGERY — ERCP, WITH INTERVENTION IF INDICATED
Anesthesia: General

## 2016-02-17 MED ORDER — PROPOFOL 500 MG/50ML IV EMUL
INTRAVENOUS | Status: DC | PRN
  Administered 2016-02-17: 125 ug/kg/min via INTRAVENOUS

## 2016-02-17 MED ORDER — INDOMETHACIN 50 MG RE SUPP
50.0000 mg | RECTAL | Status: DC
  Filled 2016-02-17: qty 1

## 2016-02-17 MED ORDER — FENTANYL CITRATE (PF) 100 MCG/2ML IJ SOLN
INTRAMUSCULAR | Status: DC | PRN
  Administered 2016-02-17: 50 ug via INTRAVENOUS

## 2016-02-17 MED ORDER — SODIUM CHLORIDE 0.9 % IV SOLN
INTRAVENOUS | Status: DC
  Administered 2016-02-17: 1000 mL via INTRAVENOUS

## 2016-02-17 MED ORDER — MIDAZOLAM HCL 2 MG/2ML IJ SOLN
INTRAMUSCULAR | Status: DC | PRN
  Administered 2016-02-17 (×2): 1 mg via INTRAVENOUS

## 2016-02-17 MED ORDER — LIDOCAINE HCL (PF) 2 % IJ SOLN
INTRAMUSCULAR | Status: DC | PRN
  Administered 2016-02-17: 60 mg via INTRADERMAL

## 2016-02-17 NOTE — Transfer of Care (Signed)
Immediate Anesthesia Transfer of Care Note  Patient: Destiny Williamson  Procedure(s) Performed: Procedure(s): ENDOSCOPIC RETROGRADE CHOLANGIOPANCREATOGRAPHY (ERCP) stent removal (N/A)  Patient Location: PACU  Anesthesia Type:General  Level of Consciousness: responds to stimulation, sleeping  Airway & Oxygen Therapy: Patient Spontanous Breathing and Patient connected to nasal cannula oxygen  Post-op Assessment: Report given to RN and Post -op Vital signs reviewed and stable  Post vital signs: Reviewed and stable  Last Vitals:  Filed Vitals:   02/17/16 1234  BP: 121/97  Pulse: 61  Temp: 36.6 C  Resp: 17    Last Pain: There were no vitals filed for this visit.       Complications: No apparent anesthesia complications

## 2016-02-17 NOTE — OR Nursing (Signed)
IV discontinued site in tact

## 2016-02-17 NOTE — Anesthesia Preprocedure Evaluation (Signed)
Anesthesia Evaluation  Patient identified by MRN, date of birth, ID band Patient awake    Reviewed: Allergy & Precautions, H&P , NPO status , Patient's Chart, lab work & pertinent test results  History of Anesthesia Complications Negative for: history of anesthetic complications  Airway Mallampati: III  TM Distance: >3 FB Neck ROM: full    Dental  (+) Poor Dentition, Chipped   Pulmonary neg shortness of breath, former smoker,    Pulmonary exam normal breath sounds clear to auscultation       Cardiovascular Exercise Tolerance: Good (-) angina(-) Past MI and (-) DOE negative cardio ROS Normal cardiovascular exam Rhythm:regular Rate:Normal     Neuro/Psych negative neurological ROS  negative psych ROS   GI/Hepatic negative GI ROS, Neg liver ROS, neg GERD  ,  Endo/Other  Hypothyroidism   Renal/GU negative Renal ROS  negative genitourinary   Musculoskeletal   Abdominal   Peds  Hematology negative hematology ROS (+)   Anesthesia Other Findings Past Medical History:   Thyroid disease                                             Past Surgical History:   TOE SURGERY                                                   APPENDECTOMY                                                 BMI    Body Mass Index   34.97 kg/m 2      Reproductive/Obstetrics negative OB ROS                             Anesthesia Physical  Anesthesia Plan  ASA: III  Anesthesia Plan: General   Post-op Pain Management:    Induction:   Airway Management Planned:   Additional Equipment:   Intra-op Plan:   Post-operative Plan:   Informed Consent: I have reviewed the patients History and Physical, chart, labs and discussed the procedure including the risks, benefits and alternatives for the proposed anesthesia with the patient or authorized representative who has indicated his/her understanding and acceptance.    Dental Advisory Given  Plan Discussed with: Anesthesiologist, CRNA and Surgeon  Anesthesia Plan Comments:         Anesthesia Quick Evaluation

## 2016-02-17 NOTE — H&P (Signed)
  [facility]  [address] [place], [place] [address] Phone: [phone] Fax : [phone]  Primary Care Physician:  Asencion PartridgeJONAS, DANIEL E, MD Primary Gastroenterologist:  Dr. Servando SnareWohl  Pre-Procedure History & Physical: HPI:  Destiny Williamson is a 50 y.o. female is here for an ERCP.   Past Medical History  Diagnosis Date  . Thyroid disease     Past Surgical History  Procedure Laterality Date  . Toe surgery    . Appendectomy    . Cholecystectomy N/A 01/31/2016    Procedure: LAPAROSCOPIC CHOLECYSTECTOMY WITH INTRAOPERATIVE CHOLANGIOGRAM;  Surgeon: Gladis Riffleatherine L Loflin, MD;  Location: [facility] ORS;  Service: General;  Laterality: N/A;  . Endoscopic retrograde cholangiopancreatography (ercp) with propofol N/A 02/01/2016    Procedure: ENDOSCOPIC RETROGRADE CHOLANGIOPANCREATOGRAPHY (ERCP) WITH PROPOFOL;  Surgeon: Midge Miniumarren Shuntay Everetts, MD;  Location: [facility] ENDOSCOPY;  Service: Endoscopy;  Laterality: N/A;    Prior to Admission medications   Medication Sig Start Date End Date Taking? Authorizing Provider  levonorgestrel (MIRENA) 20 MCG/24HR IUD by Intrauterine route.    Historical Provider, MD  levothyroxine (SYNTHROID, LEVOTHROID) 125 MCG tablet Take 125 mcg by mouth daily before breakfast.  08/03/15   Historical Provider, MD  oxyCODONE (OXY IR/ROXICODONE) 5 MG immediate release tablet Take 1-2 tablets (5-10 mg total) by mouth every 4 (four) hours as needed for moderate pain. 02/01/16   Lattie Hawichard E Cooper, MD    Allergies as of 02/13/2016  . (No Known Allergies)    History reviewed. No pertinent family history.  Social History   Social History  . Marital Status: Married    Spouse Name: N/A  . Number of Children: N/A  . Years of Education: N/A   Occupational History  . Not on file.   Social History Main Topics  . Smoking status: Former Games developermoker  . Smokeless tobacco: Not on file  . Alcohol Use: Not on file  . Drug Use: Not on file  . Sexual Activity: Not on file   Other Topics  Concern  . Not on file   Social History Narrative    Review of Systems: See HPI, otherwise negative ROS  Physical Exam: BP 121/97 mmHg  Pulse 61  Temp(Src) 97.8 F (36.6 C) (Tympanic)  Resp 17  Ht 5\' 8"  (1.727 m)  Wt 235 lb (106.595 kg)  BMI 35.74 kg/m2  SpO2 100%  LMP  General:   Alert,  pleasant and cooperative in NAD Head:  Normocephalic and atraumatic. Neck:  Supple; no masses or thyromegaly. Lungs:  Clear throughout to auscultation.    Heart:  Regular rate and rhythm. Abdomen:  Soft, nontender and nondistended. Normal bowel sounds, without guarding, and without rebound.   Neurologic:  Alert and  oriented x4;  grossly normal neurologically.  Impression/Plan: Destiny Williamson is here for an ERCP to be performed for Stent removal.  Risks, benefits, limitations, and alternatives regarding  ERCP have been reviewed with the patient.  Questions have been answered.  All parties agreeable.   Midge Miniumarren Maythe Deramo, MD  02/17/2016, 1:23 PM

## 2016-02-17 NOTE — Anesthesia Postprocedure Evaluation (Signed)
Anesthesia Post Note  Patient: Laurey ArrowRuth B Kawai  Procedure(s) Performed: Procedure(s) (LRB): ENDOSCOPIC RETROGRADE CHOLANGIOPANCREATOGRAPHY (ERCP) stent removal (N/A)  Patient location during evaluation: Endoscopy Anesthesia Type: General Level of consciousness: awake and alert Pain management: pain level controlled Vital Signs Assessment: post-procedure vital signs reviewed and stable Respiratory status: spontaneous breathing, nonlabored ventilation, respiratory function stable and patient connected to nasal cannula oxygen Cardiovascular status: blood pressure returned to baseline and stable Postop Assessment: no signs of nausea or vomiting Anesthetic complications: no    Last Vitals:  Filed Vitals:   02/17/16 1420 02/17/16 1432  BP: 131/81 126/76  Pulse: 62 54  Temp:    Resp: 16 12    Last Pain: There were no vitals filed for this visit.               Lenard SimmerAndrew Peterson Mathey

## 2016-02-17 NOTE — OR Nursing (Signed)
Left arm red and inflammed the patient states it is from iv 2 weeks ago . Examine d by DR Minerva FesterKRANZAS. AND WOHL.

## 2016-02-17 NOTE — Op Note (Signed)
[facility] Gastroenterology Patient Name: Destiny Williamson Procedure Date: 02/17/2016 1:40 PM MRN: [id number] Account #: [id number] Date of Birth: 06/22/1966 Admit Type: Outpatient Age: 50 Room: [facility] ENDO ROOM 4 Gender: Female Note Status: Finalized Procedure:            ERCP Indications:          Pancreatic stent removal Providers:            Destiny Minium, MD Referring MD:         No Local Md, MD (Referring MD) Medicines:            Propofol per Anesthesia Complications:        No immediate complications. Procedure:            Pre-Anesthesia Assessment:                       - Prior to the procedure, a History and Physical was                        performed, and patient medications and allergies were                        reviewed. The patient's tolerance of previous                        anesthesia was also reviewed. The risks and benefits of                        the procedure and the sedation options and risks were                        discussed with the patient. All questions were                        answered, and informed consent was obtained. Prior                        Anticoagulants: The patient has taken no previous                        anticoagulant or antiplatelet agents. ASA Grade                        Assessment: II - A patient with mild systemic disease.                        After reviewing the risks and benefits, the patient was                        deemed in satisfactory condition to undergo the                        procedure.                       After obtaining informed consent, the scope was passed                        under direct vision. Throughout the procedure, the  patient's blood pressure, pulse, and oxygen saturations                        were monitored continuously. The Endoscope was                        introduced through the mouth, and used to inject                        contrast into  and used to locate the major papilla. The                        ERCP was accomplished without difficulty. The patient                        tolerated the procedure well. Findings:      A scout film of the abdomen was obtained. Surgical clips, consistent       with a previous cholecystectomy, were seen in the area of the right       upper quadrant of the abdomen. A major papilla precut sphincterotomy had       been performed. Stent no longer in place. Impression:           - Prior major papilla endoscopic sphincterotomy                       - Previously placed stent has fallen out. Recommendation:       - Resume regular diet. Procedure Code(s):    --- Professional ---                       [phone], Esophagogastroduodenoscopy, flexible, transoral;                        diagnostic, including collection of specimen(s) by                        brushing or washing, when performed (separate procedure) Diagnosis Code(s):    --- Professional ---                       B14.78Z46.59, Encounter for fitting and adjustment of other                        gastrointestinal appliance and device CPT copyright 2016 American Medical Association. All rights reserved. The codes documented in this report are preliminary and upon coder review may  be revised to meet current compliance requirements. Destiny Miniumarren Chavon Lucarelli, MD 02/17/2016 1:57:20 PM This report has been signed electronically. Number of Addenda: 0 Note Initiated On: 02/17/2016 1:40 PM      [facility]

## 2016-02-21 ENCOUNTER — Encounter: Payer: Self-pay | Admitting: Gastroenterology

## 2016-02-22 ENCOUNTER — Ambulatory Visit
Admission: EM | Admit: 2016-02-22 | Discharge: 2016-02-22 | Disposition: A | Discharge status: Home / Self Care | Payer: 59 | Attending: Family Medicine | Admitting: Family Medicine

## 2016-02-22 ENCOUNTER — Encounter: Payer: Self-pay | Admitting: *Deleted

## 2016-02-22 DIAGNOSIS — I808 Phlebitis and thrombophlebitis of other sites: Secondary | ICD-10-CM | POA: Diagnosis not present

## 2016-02-22 MED ORDER — CEFUROXIME AXETIL 500 MG PO TABS: 500.0000 mg | ORAL_TABLET | Freq: Two times a day (BID) | ORAL | Status: DC

## 2016-02-22 MED ORDER — MELOXICAM 15 MG PO TABS: 15.0000 mg | ORAL_TABLET | Freq: Every day | ORAL | Status: DC

## 2016-02-22 NOTE — ED Provider Notes (Signed)
CSN: [id number]     Arrival date & time 02/22/16  1603 History   First MD Initiated Contact with Patient 02/22/16 1657    Nurses notes were reviewed. Chief Complaint  Patient presents with  . Arm Pain   Patient was seen at [facility] early in May. At that time she wound up having a cholecystectomy for gallstones. This past week she had an ERCP on Friday. She mentioned to the same day surgery suite people about the pain she was having in her left arm. Her IV was started on the right arm but they suggested that she see her PCP at the left arm got worse. She has several IVs in the left arm couple of them blue with deposition of fluid outside of the vessel and irritation of the arm when she was having IVs started. She never had a history of phlebitis or irritation of the veins in her arms before.  She does not smoke she is a former smoker. No significant family medical history that is pertinent to today's visit. She has a history of thyroid disease but is not on any chronic medication or chronic medical problems.    (Consider location/radiation/quality/duration/timing/severity/associated sxs/prior Treatment) Patient is a 50 y.o. female presenting with arm pain. The history is provided by the patient. No language interpreter was used.  Arm Pain This is a new problem. The current episode started more than 1 week ago. The problem occurs constantly. The problem has been gradually worsening. Pertinent negatives include no chest pain, no abdominal pain and no headaches. Exacerbated by: Things touching or hitting the left arm. Nothing relieves the symptoms. She has tried nothing for the symptoms. The treatment provided no relief.    Past Medical History  Diagnosis Date  . Thyroid disease    Past Surgical History  Procedure Laterality Date  . Toe surgery    . Appendectomy    . Cholecystectomy N/A 01/31/2016    Procedure: LAPAROSCOPIC CHOLECYSTECTOMY WITH INTRAOPERATIVE CHOLANGIOGRAM;  Surgeon: Gladis Riffle, MD;  Location: [facility] ORS;  Service: General;  Laterality: N/A;  . Endoscopic retrograde cholangiopancreatography (ercp) with propofol N/A 02/01/2016    Procedure: ENDOSCOPIC RETROGRADE CHOLANGIOPANCREATOGRAPHY (ERCP) WITH PROPOFOL;  Surgeon: Midge Minium, MD;  Location: [facility] ENDOSCOPY;  Service: Endoscopy;  Laterality: N/A;  . Ercp N/A 02/17/2016    Procedure: ENDOSCOPIC RETROGRADE CHOLANGIOPANCREATOGRAPHY (ERCP) stent removal;  Surgeon: Midge Minium, MD;  Location: [facility] ENDOSCOPY;  Service: Endoscopy;  Laterality: N/A;   History reviewed. No pertinent family history. Social History  Substance Use Topics  . Smoking status: Former Games developer  . Smokeless tobacco: Never Used  . Alcohol Use: No   OB History    No data available     Review of Systems  Cardiovascular: Negative for chest pain.  Gastrointestinal: Negative for abdominal pain.  Neurological: Negative for headaches.  All other systems reviewed and are negative.   Allergies  Review of patient's allergies indicates no known allergies.  Home Medications   Prior to Admission medications   Medication Sig Start Date End Date Taking? Authorizing Provider  cefUROXime (CEFTIN) 500 MG tablet Take 1 tablet (500 mg total) by mouth 2 (two) times daily. 02/22/16   Hassan Rowan, MD  levonorgestrel (MIRENA) 20 MCG/24HR IUD by Intrauterine route.    Historical Provider, MD  levothyroxine (SYNTHROID, LEVOTHROID) 125 MCG tablet Take 125 mcg by mouth daily before breakfast.  08/03/15   Historical Provider, MD  meloxicam (MOBIC) 15 MG tablet Take 1 tablet (15 mg total) by  mouth daily. 02/22/16   Hassan RowanEugene Arlie Posch, MD  oxyCODONE (OXY IR/ROXICODONE) 5 MG immediate release tablet Take 1-2 tablets (5-10 mg total) by mouth every 4 (four) hours as needed for moderate pain. 02/01/16   Lattie Hawichard E Cooper, MD   Meds Ordered and Administered this Visit  Medications - No data to display  BP 132/71 mmHg  Pulse 61  Temp(Src) 98.1 F (36.7 C) (Oral)  Resp 18  Ht 5'  8" (1.727 m)  Wt 233 lb (105.688 kg)  BMI 35.44 kg/m2  SpO2 99% No data found.   Physical Exam  Constitutional: She is oriented to person, place, and time. She appears well-developed and well-nourished.  HENT:  Head: Normocephalic and atraumatic.  Eyes: Conjunctivae are normal. Pupils are equal, round, and reactive to light.  Pulmonary/Chest: Effort normal.  Musculoskeletal: She exhibits tenderness.       Arms: She's got venous dilatation on the left arm and the tenderness is most exquisite over the dilated vein on the left arm consistent with the site where she received IV fluids  Neurological: She is alert and oriented to person, place, and time.  Skin: There is erythema.  Vitals reviewed.   ED Course  Procedures (including critical care time)  Labs Review Labs Reviewed - No data to display  Imaging Review No results found.   Visual Acuity Review  Right Eye Distance:   Left Eye Distance:   Bilateral Distance:    Right Eye Near:   Left Eye Near:    Bilateral Near:         MDM   1. Superficial phlebitis of arm    We'll place patient on Mobic 15 mg 1 tablet a day and Ceftin 500 one tablet twice a day in case of infection. Please follow-up PCP of choice if not better in the next 3-5 days. If fever starts or gets worse she will need to go to the ED of her choice.  Note: This dictation was prepared with Dragon dictation along with smaller phrase technology. Any transcriptional errors that result from this process are unintentional.  Hassan RowanEugene Danice Dippolito, MD 02/22/16 [phone]

## 2016-02-22 NOTE — ED Notes (Signed)
Patient has had left arm pain for 1.5 weeks. Patient reports having a IV infiltrate in her left arm during her stay at the hospital.

## 2016-02-22 NOTE — Discharge Instructions (Signed)
Phlebitis °Phlebitis is soreness and puffiness (swelling) in a vein.  °HOME CARE °· Only take medicine as told by your doctor. °· Raise (elevate) the affected limb on a pillow as told by your doctor. °· Keep a warm pack on the affected vein as told by your doctor. Do not sleep with a heating pad. °· Use special stockings or bandages around the area of the affected vein as told by your doctor. These will speed healing and keep the condition from coming back. °· Talk to your doctor about all the medicines you take. °· Get follow-up blood tests as told by your doctor. °· If the phlebitis is in your legs: °¨ Avoid standing or resting for long periods. °¨ Keep your legs moving. Raise your legs when you sit or lie. °· Do not smoke. °· Follow-up with your doctor as told. °GET HELP IF: °· You have strange bruises or bleeding. °· Your puffiness or pain in the affected area is not getting better. °· You are taking medicine to lessen puffiness (anti-inflammatory medicine), and you get belly pain. °· You have a fever. °GET HELP RIGHT AWAY IF:  °· The phlebitis gets worse and you have more pain, puffiness (swelling), or redness. °· You have trouble breathing or have chest pain. °MAKE SURE YOU:  °· Understand these instructions. °· Will watch your condition. °· Will get help right away if you are not doing well or get worse. °  °This information is not intended to replace advice given to you by your health care provider. Make sure you discuss any questions you have with your health care provider. °  °Document Released: 09/05/2009 Document Revised: 09/22/2013 Document Reviewed: 05/25/2013 °Elsevier Interactive Patient Education ©2016 Elsevier Inc. ° °

## 2016-05-24 IMAGING — US US ABDOMEN LIMITED
1 series · 14 of 25 positions shown · non-contrast
Comparison: CT 12/21/2014

CLINICAL DATA: Right upper quadrant pain .

EXAM:
US ABDOMEN LIMITED - RIGHT UPPER QUADRANT

[Series 1: us abdomen limited · 0.26mm/px · 14 of 59 slices shown]
[im 1/59]
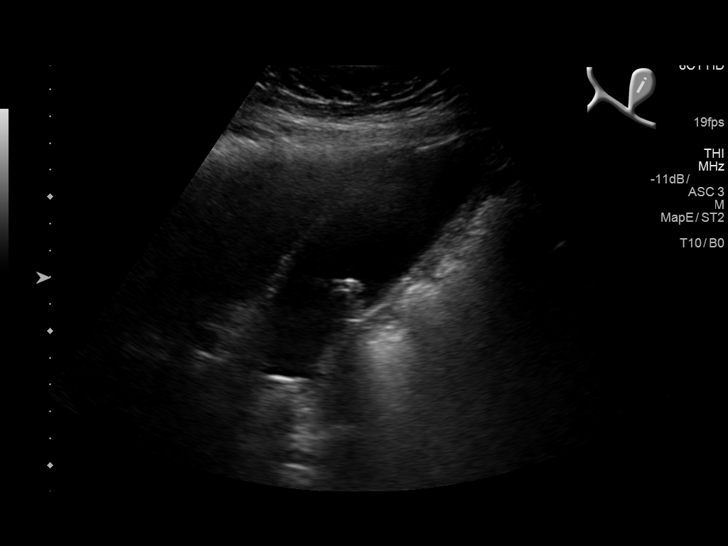
[im 5/59]
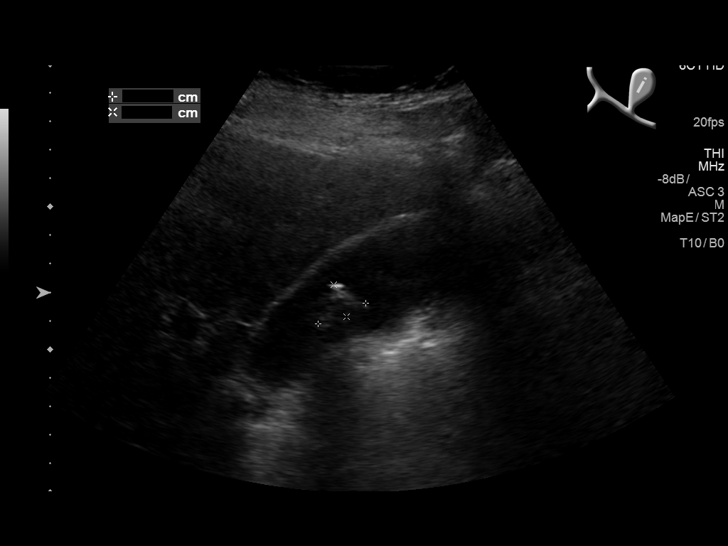
[im 10/59]
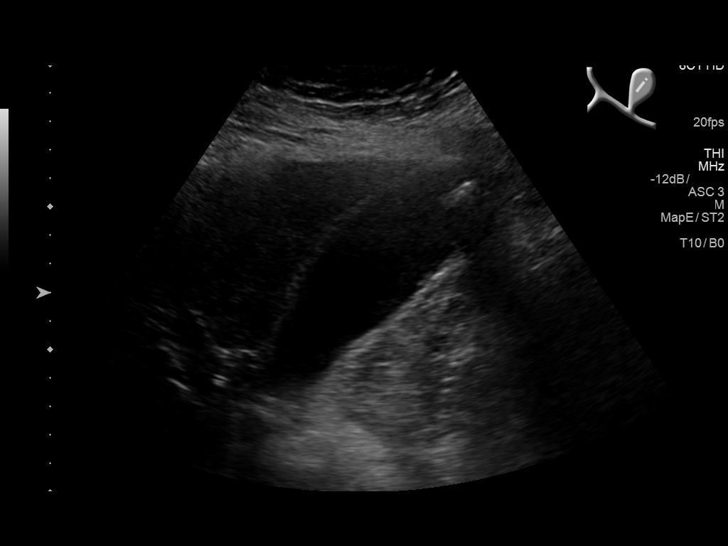
[im 15/59]
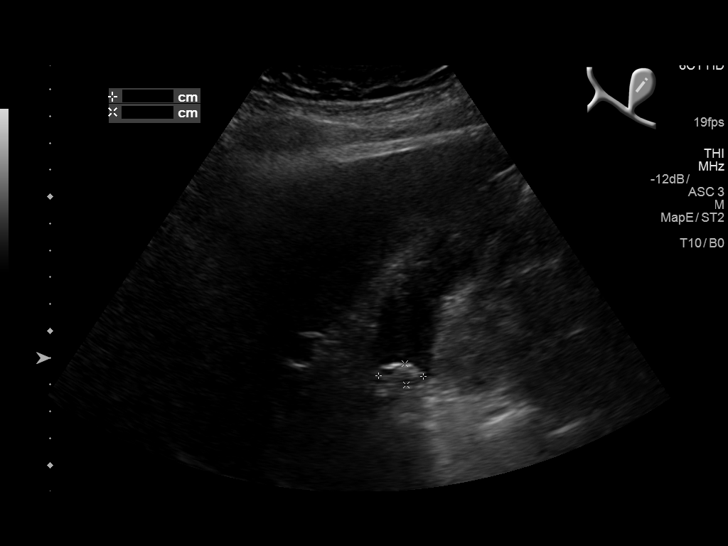
[im 20/59]
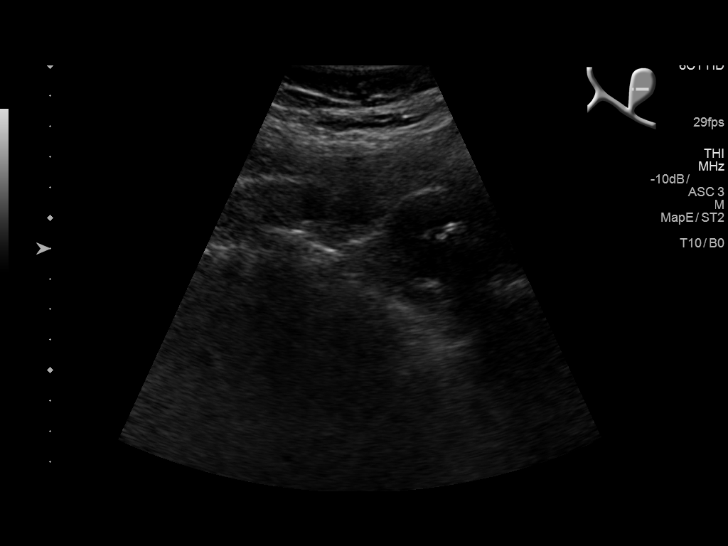
[im 22/59]
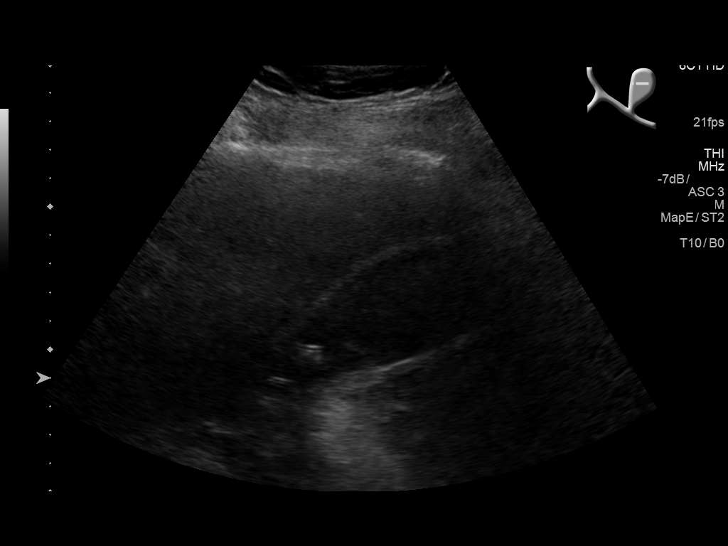
[im 27/59]
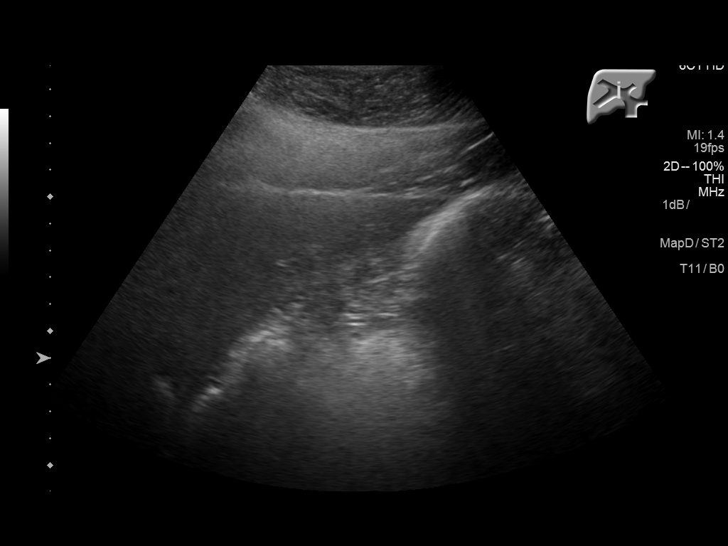
[im 32/59]
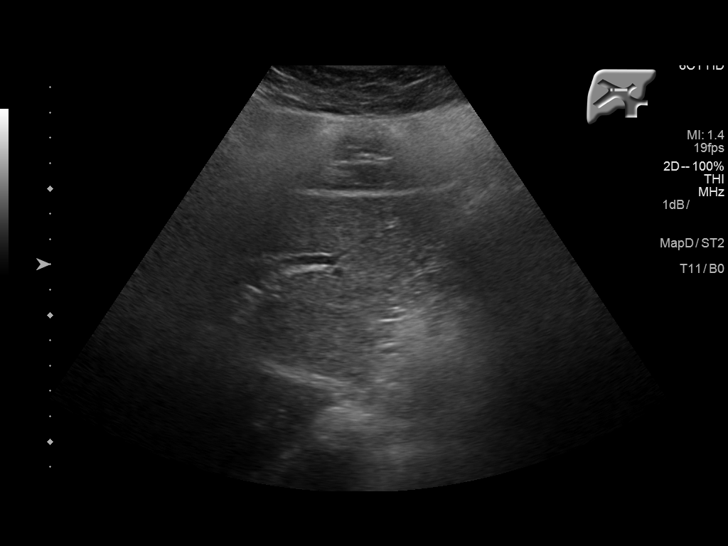
[im 37/59]
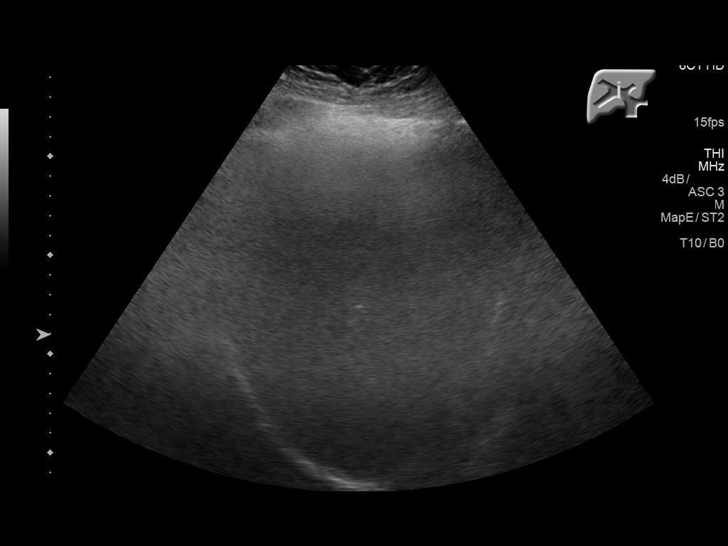
[im 39/59]
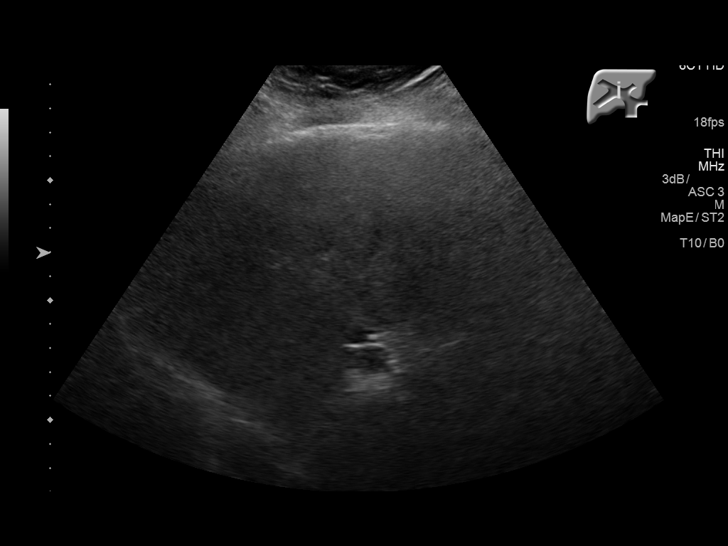
[im 44/59]
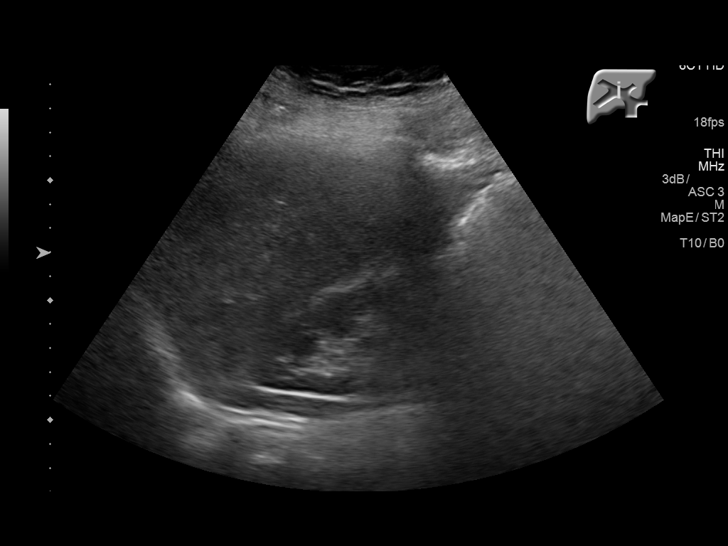
[im 49/59]
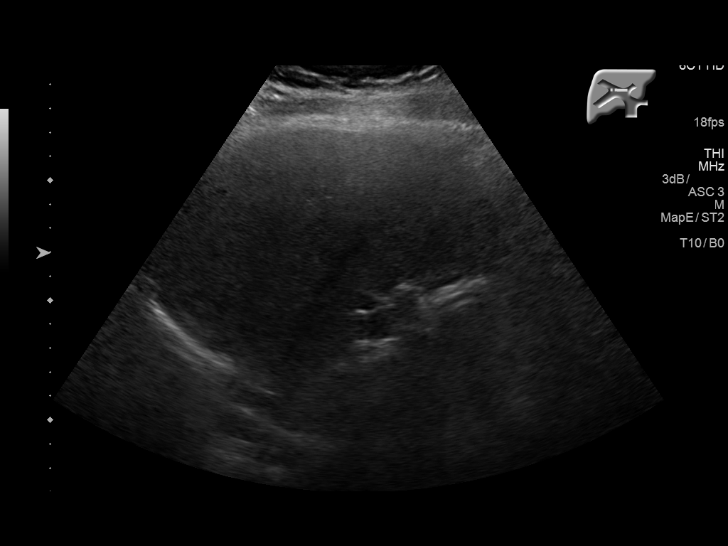
[im 54/59]
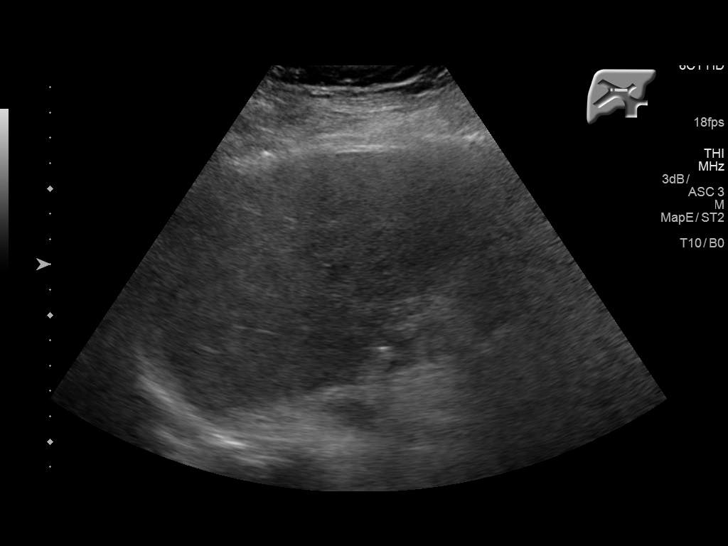
[im 59/59]
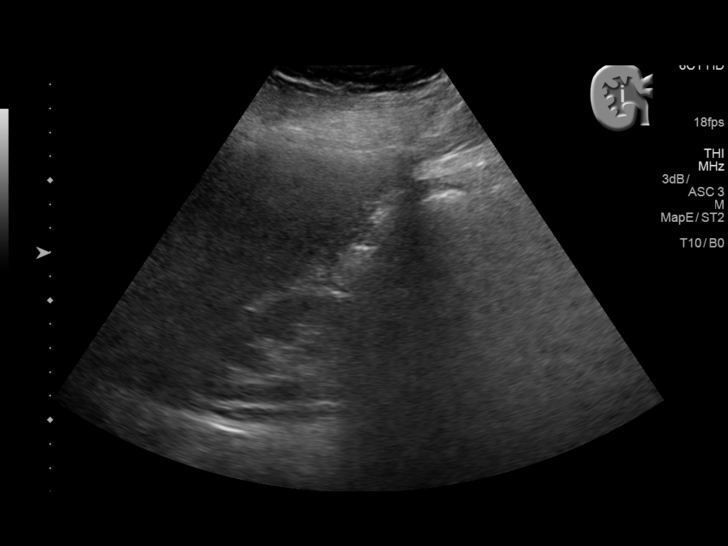

[14 of 25 positions shown; findings below may reference images not displayed]

FINDINGS: Gallbladder:

Multiple gallstones noted. Multiple appear non mobile. Stones noted
within the neck of the gallbladder. The largest measures 1.8 cm
gallbladder wall thickness 2.8 mm. No pericholecystic fluid
collection. Negative Murphy sign.

Common bile duct:

Diameter: 14.3 mm. Debris within the distal common bile duct cannot
be completely excluded.

Liver:

Liver is echogenic consistent fatty infiltration and/or
hepatocellular disease.
IMPRESSION: 1. Multiple gallstones. Some appear non mobile. Gallstones in the
neck of the gallbladder.

2. Prominently dilated common bile duct at 14.3 mm. Debris within
the distal common bile duct cannot be excluded. MRCP can be obtained
as needed.

## 2017-03-24 IMAGING — DX DG CHEST 1V PORT
1 series · 1 of 1 positions shown · non-contrast
Comparison: None.

CLINICAL DATA: Patient woke at 4 a.m. with upper chest pain
radiating to the back. Nausea. Former smoker.

EXAM:
PORTABLE CHEST 1 VIEW

[chest ap]
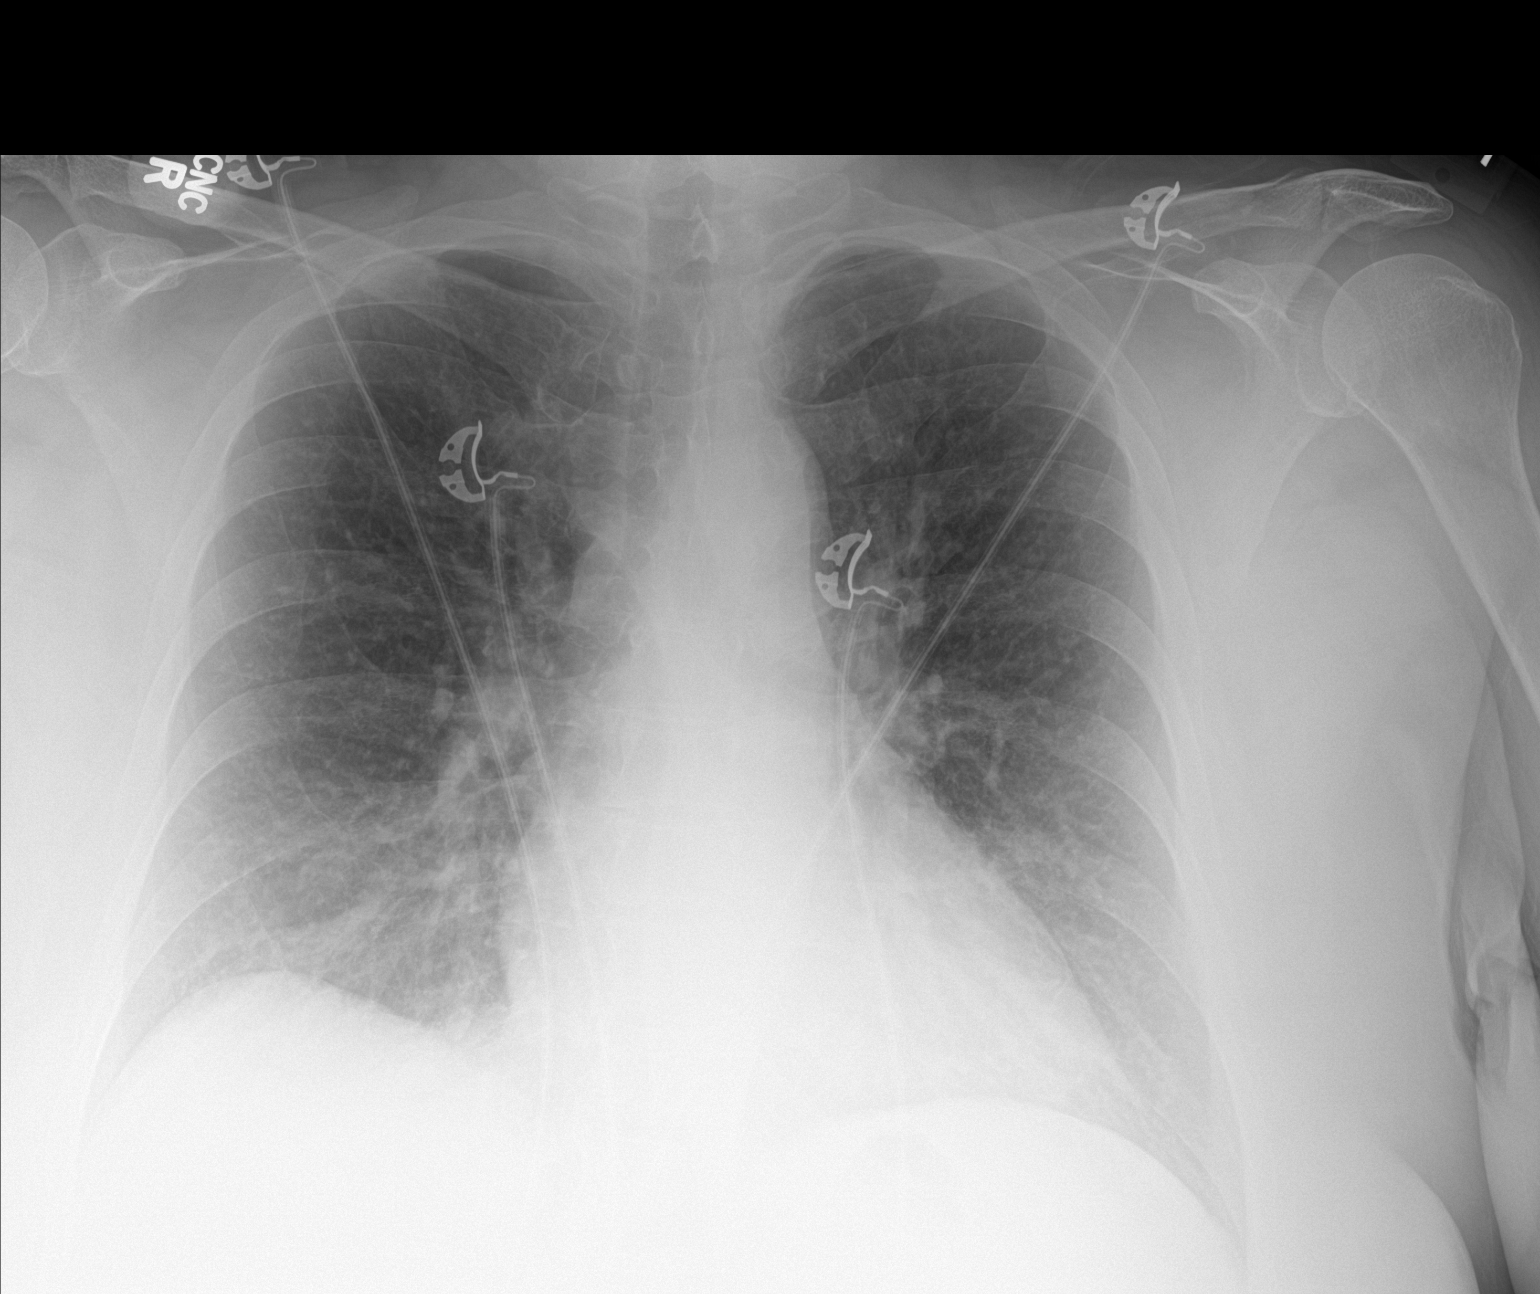

[1 of 1 positions shown; findings below may reference images not displayed]

FINDINGS: The heart size and mediastinal contours are within normal limits.
Both lungs are clear. The visualized skeletal structures are
unremarkable.
IMPRESSION: No active disease.

## 2017-03-24 IMAGING — CR DG CHOLANGIOGRAM OPERATIVE
1 series · 10 of 10 positions shown · non-contrast
Comparison: Ultrasound 01/31/2016

CLINICAL DATA: Cholecystitis.

EXAM:
INTRAOPERATIVE CHOLANGIOGRAM
TECHNIQUE: Cholangiographic images from the C-arm fluoroscopic device were
submitted for interpretation post-operatively. Please see the
procedural report for the amount of contrast and the fluoroscopy
time utilized.

[Series 7: cont. · 10 of 201 frames shown]
[frame 1/201]
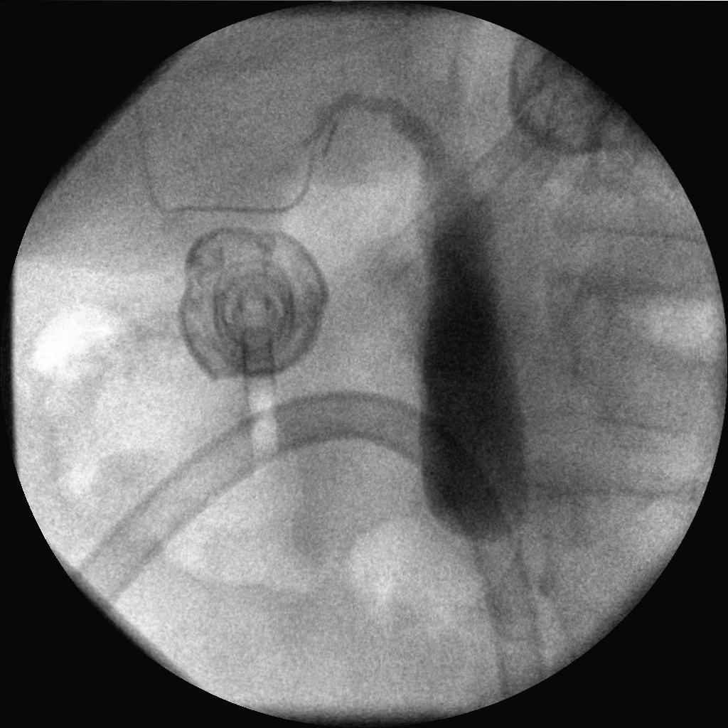
[frame 23/201]
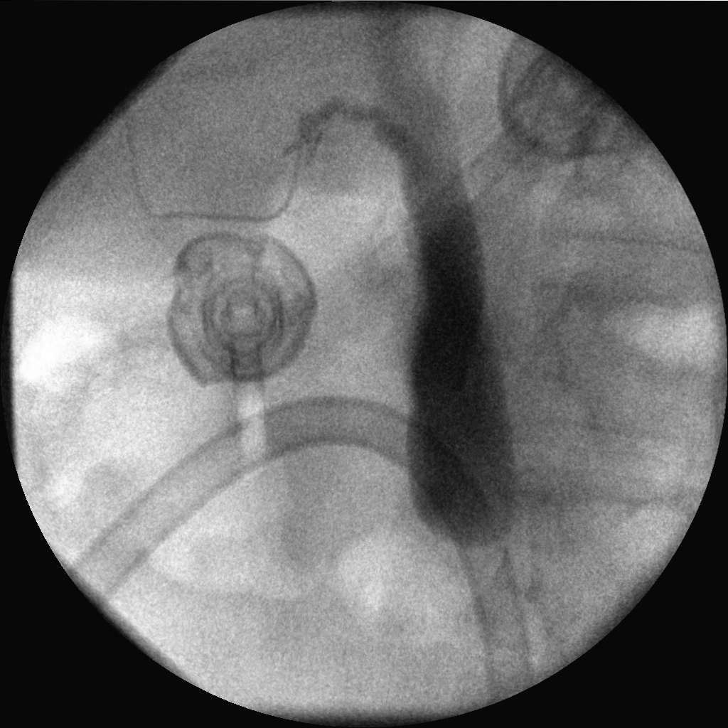
[frame 45/201]
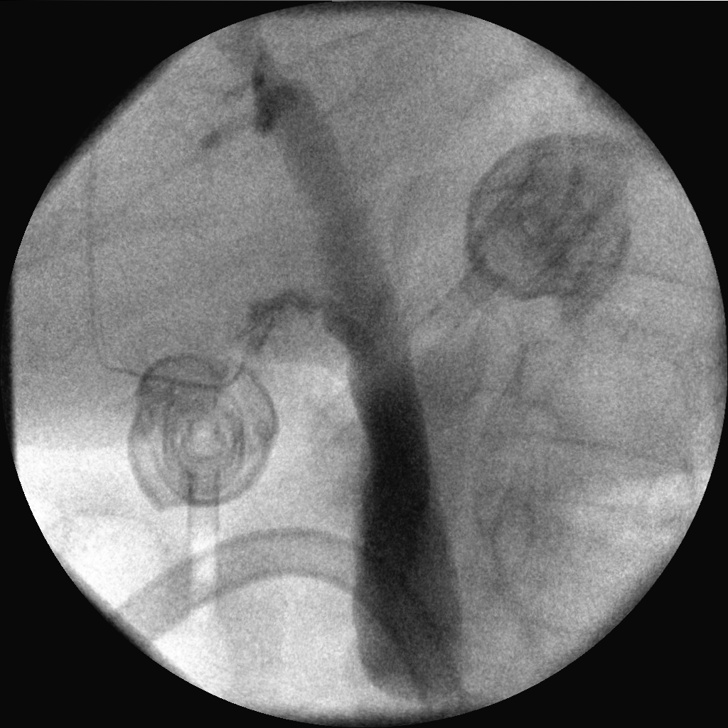
[frame 67/201]
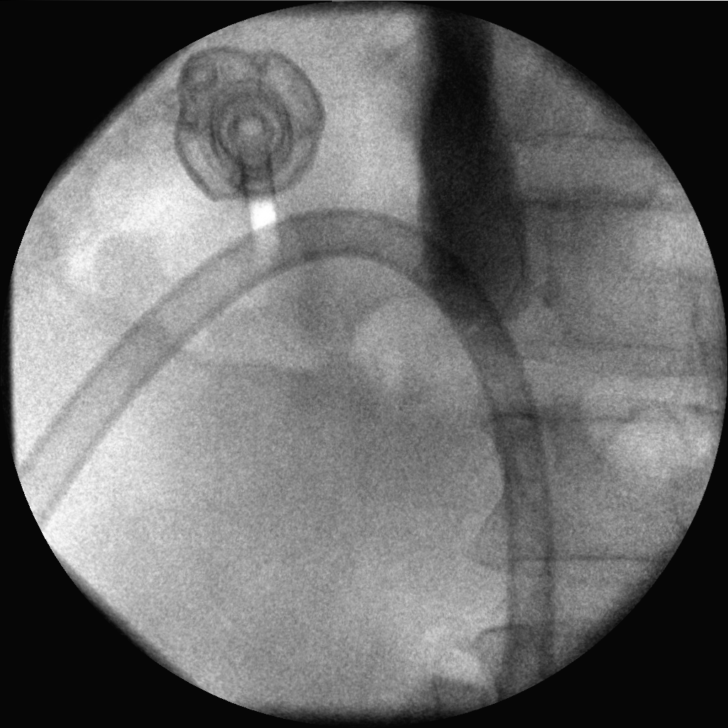
[frame 89/201]
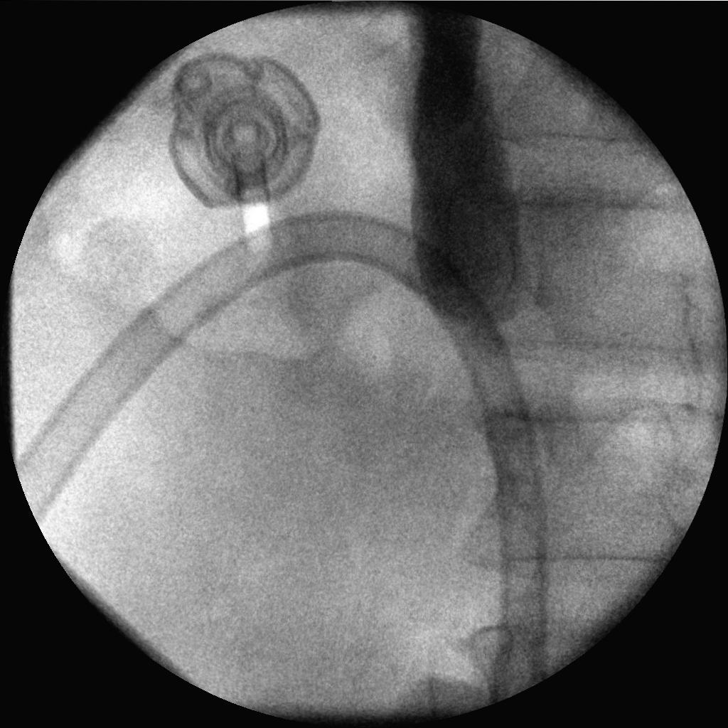
[frame 112/201]
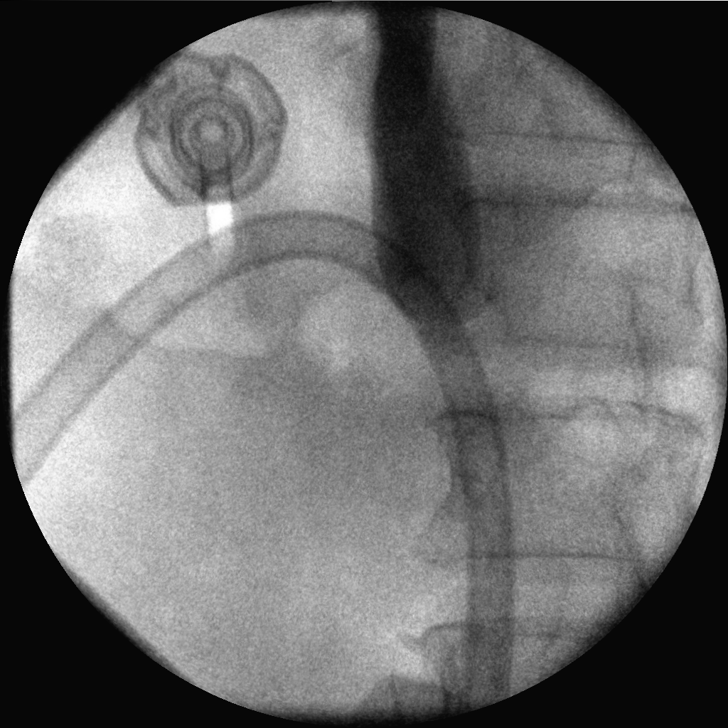
[frame 134/201]
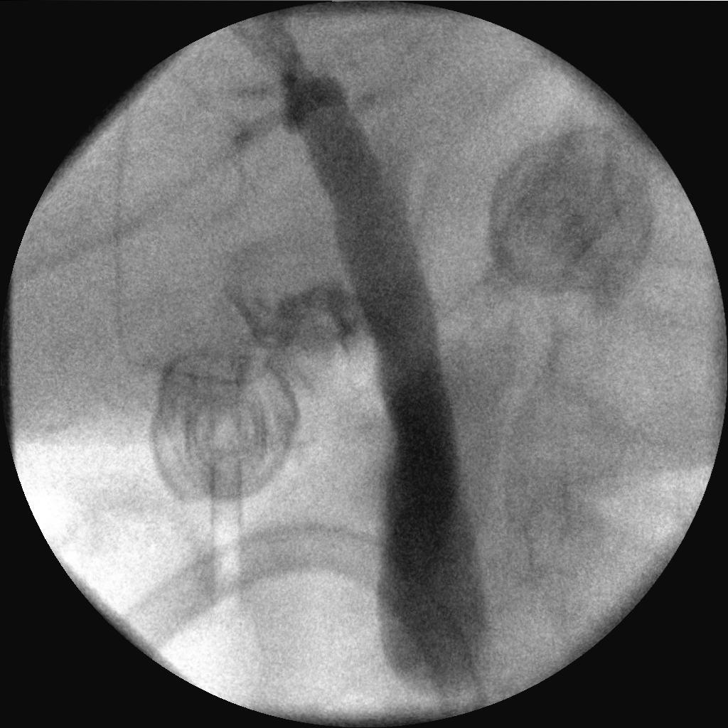
[frame 156/201]
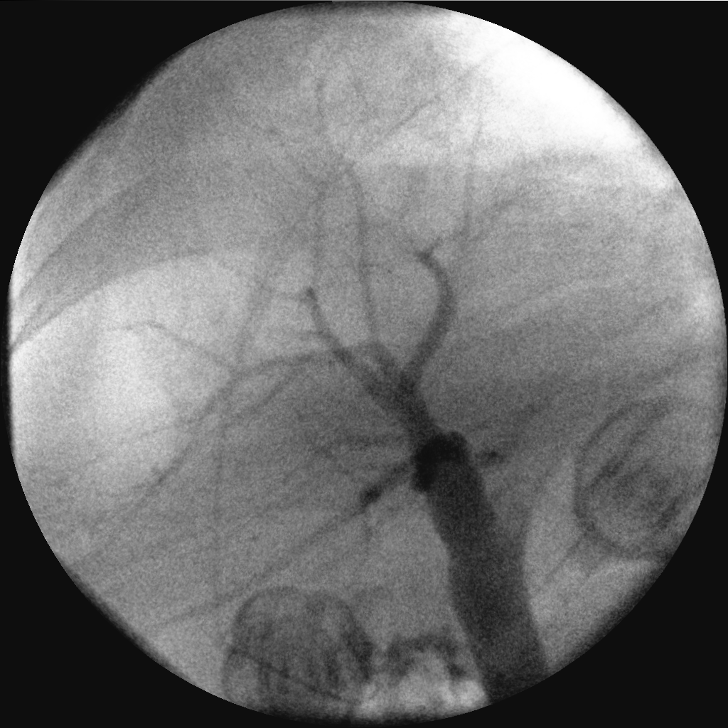
[frame 178/201]
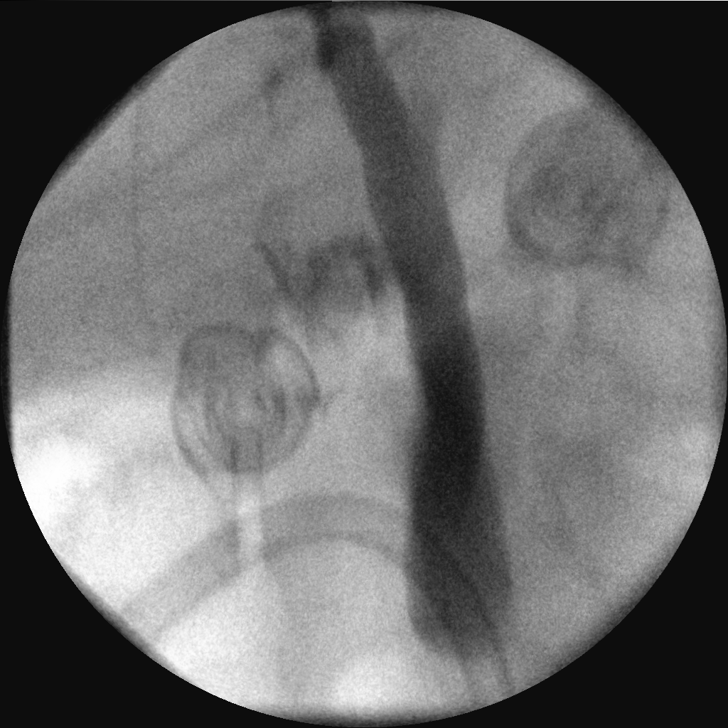
[frame 201/201]
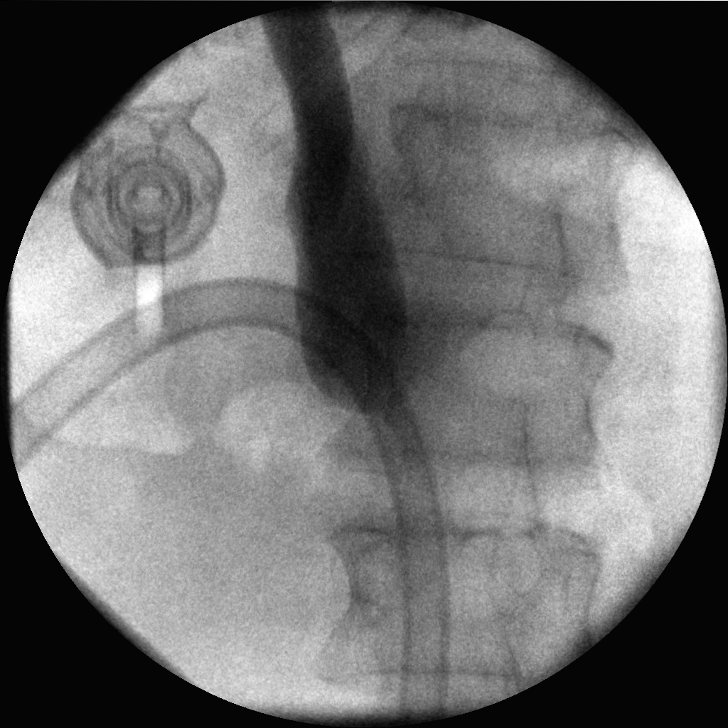

[10 of 10 positions shown; findings below may reference images not displayed]

FINDINGS: Contrast opacification of the common bile duct, common hepatic duct
and intrahepatic ducts. There is dilatation of the biliary system,
particularly the common bile duct. No large filling defects but
contrast does not drain into the duodenum.
IMPRESSION: Dilated intrahepatic and extrahepatic biliary system without filling
defects but no drainage into the duodenum. Findings raise concern
for a distal common bile duct obstruction. Consider further
evaluation with MRCP or ERCP.

## 2017-12-19 ENCOUNTER — Encounter: Payer: Self-pay | Admitting: *Deleted

## 2017-12-19 ENCOUNTER — Ambulatory Visit
Admission: EM | Admit: 2017-12-19 | Discharge: 2017-12-19 | Disposition: A | Discharge status: Home / Self Care | Payer: 59 | Attending: Family Medicine | Admitting: Family Medicine

## 2017-12-19 DIAGNOSIS — J069 Acute upper respiratory infection, unspecified: Secondary | ICD-10-CM

## 2017-12-19 LAB — RAPID STREP SCREEN (MED CTR MEBANE ONLY): Streptococcus, Group A Screen (Direct): NEGATIVE

## 2017-12-19 MED ORDER — CETIRIZINE HCL 10 MG PO TABS
10.0000 mg | ORAL_TABLET | Freq: Every day | ORAL | 0 refills | Status: DC
Start: 2017-12-19 — End: 2020-06-02

## 2017-12-19 MED ORDER — BENZONATATE 200 MG PO CAPS: ORAL_CAPSULE | ORAL | 0 refills | Status: DC

## 2017-12-19 MED ORDER — FLUTICASONE PROPIONATE 50 MCG/ACT NA SUSP: 2.0000 | Freq: Every day | NASAL | 0 refills | Status: DC

## 2017-12-19 NOTE — ED Provider Notes (Signed)
[facility]    CSN: [id number] Arrival date & time: 12/19/17  1726     History   Chief Complaint Chief Complaint  Patient presents with  . Sore Throat    HPI Destiny Williamson is a 52 y.o. female.   HPI  52 year old female presents with sore throat cough and runny nose that started yesterday.  No fever.  States that she has had several episodes of strep throat in the past.  She is afebrile pulse rate of 82 blood pressure 130/70 O2 sats on room air 98%.  Seems to have started today and is productive of yellow-green sputum.  She says the cough is intermittent more than continuous.         Past Medical History:  Diagnosis Date  . Thyroid disease     Patient Active Problem List   Diagnosis Date Noted  . Fitting and adjustment of gastrointestinal appliance and device   . Biliary colic   . Other specified diseases of biliary tract   . Common bile duct filling defect, non-specific   . Choledocholithiasis with acute cholecystitis 01/31/2016  . Fatigue 02/17/2014  . Adiposity 02/17/2014  . Extremity pain 11/28/2011  . FOM (frequency of micturition) 11/28/2011  . Adult hypothyroidism 10/31/2011    Past Surgical History:  Procedure Laterality Date  . APPENDECTOMY    . CHOLECYSTECTOMY N/A 01/31/2016   Procedure: LAPAROSCOPIC CHOLECYSTECTOMY WITH INTRAOPERATIVE CHOLANGIOGRAM;  Surgeon: Gladis Riffleatherine L Loflin, MD;  Location: [facility] ORS;  Service: General;  Laterality: N/A;  . ENDOSCOPIC RETROGRADE CHOLANGIOPANCREATOGRAPHY (ERCP) WITH PROPOFOL N/A 02/01/2016   Procedure: ENDOSCOPIC RETROGRADE CHOLANGIOPANCREATOGRAPHY (ERCP) WITH PROPOFOL;  Surgeon: Midge Miniumarren Wohl, MD;  Location: [facility] ENDOSCOPY;  Service: Endoscopy;  Laterality: N/A;  . ERCP N/A 02/17/2016   Procedure: ENDOSCOPIC RETROGRADE CHOLANGIOPANCREATOGRAPHY (ERCP) stent removal;  Surgeon: Midge Miniumarren Wohl, MD;  Location: [facility] ENDOSCOPY;  Service: Endoscopy;  Laterality: N/A;  . TOE SURGERY      OB History   None       Home Medications    Prior to Admission medications   Medication Sig Start Date End Date Taking? Authorizing Provider  benzonatate (TESSALON) 200 MG capsule Take one cap TID PRN cough 12/19/17   Lutricia Feiloemer, Carlton Buskey P, PA-C  cetirizine (ZYRTEC ALLERGY) 10 MG tablet Take 1 tablet (10 mg total) by mouth daily. 12/19/17   Lutricia Feiloemer, Audrielle Vankuren P, PA-C  fluticasone (FLONASE) 50 MCG/ACT nasal spray Place 2 sprays into both nostrils daily. 12/19/17   Lutricia Feiloemer, Vivian Neuwirth P, PA-C  levonorgestrel (MIRENA) 20 MCG/24HR IUD by Intrauterine route.    [provider]  levonorgestrel (MIRENA) 20 MCG/24HR IUD 20 Intra Uterine Devices by Intrauterine route daily.    [provider]  levothyroxine (SYNTHROID, LEVOTHROID) 112 MCG tablet Take 125 mcg by mouth every morning. 12/12/17   [provider]    Family History History reviewed. No pertinent family history.  Social History Social History   Tobacco Use  . Smoking status: Former Games developermoker  . Smokeless tobacco: Never Used  Substance Use Topics  . Alcohol use: No    Alcohol/week: 0.0 oz  . Drug use: No     Allergies   Patient has no known allergies.   Review of Systems Review of Systems  Constitutional: Positive for activity change, appetite change and fatigue.  Gastrointestinal: Positive for abdominal pain, diarrhea and nausea.  Genitourinary: Negative for vaginal bleeding, vaginal discharge and vaginal pain.  All other systems reviewed and are negative.    Physical Exam Triage Vital Signs ED  Triage Vitals  Enc Vitals Group     BP 12/19/17 1739 130/70     Pulse Rate 12/19/17 1739 82     Resp 12/19/17 1739 16     Temp 12/19/17 1739 98.8 F (37.1 C)     Temp Source 12/19/17 1739 Oral     SpO2 12/19/17 1739 98 %     Weight 12/19/17 1734 232 lb (105.2 kg)     Height 12/19/17 1734 5\' 7"  (1.702 m)     Head Circumference --      Peak Flow --      Pain Score 12/19/17 1734 0     Pain Loc --      Pain Edu? --      Excl.  in GC? --    No data found.  Updated Vital Signs BP 130/70 (BP Location: Left Arm)   Pulse 82   Temp 98.8 F (37.1 C) (Oral)   Resp 16   Ht 5\' 7"  (1.702 m)   Wt 232 lb (105.2 kg)   SpO2 98%   BMI 36.34 kg/m   Visual Acuity Right Eye Distance:   Left Eye Distance:   Bilateral Distance:    Right Eye Near:   Left Eye Near:    Bilateral Near:     Physical Exam  Constitutional: She is oriented to person, place, and time. She appears well-developed and well-nourished.  Non-toxic appearance. She does not appear ill. No distress.  HENT:  Head: Normocephalic.  Right Ear: Tympanic membrane and ear canal normal.  Left Ear: Tympanic membrane and ear canal normal.  Mouth/Throat: Uvula is midline, oropharynx is clear and moist and mucous membranes are normal.  Eyes: Pupils are equal, round, and reactive to light.  Neck: Normal range of motion. Neck supple.  Pulmonary/Chest: Effort normal and breath sounds normal.  Abdominal: Soft. Bowel sounds are normal. There is tenderness.  Abdominal examination was performed with Efraim Kaufmann, CMA chaperone and assistant.  Patient has no distention present.  Bowel sounds are present and active.  He has generalized the tenderness does seem to be most tender in the right lower quadrant and suprapubic area.  There is no rebound and no guarding present.  Neurological: She is alert and oriented to person, place, and time.  Skin: Skin is warm and dry.  Psychiatric: She has a normal mood and affect. Her behavior is normal.  Nursing note and vitals reviewed.    UC Treatments / Results  Labs (all labs ordered are listed, but only abnormal results are displayed) Labs Reviewed  RAPID STREP SCREEN (NOT AT [facility])  CULTURE, GROUP A STREP [facility])    EKG  EKG Interpretation None       Radiology No results found.  Procedures Procedures (including critical care time)  Medications Ordered in UC Medications - No data to display   Initial Impression /  Assessment and Plan / UC Course  I have reviewed the triage vital signs and the nursing notes.  Pertinent labs & imaging results that were available during my care of the patient were reviewed by me and considered in my medical decision making (see chart for details).     Plan: 1. Test/x-ray results and diagnosis reviewed with patient 2. rx as per orders; risks, benefits, potential side effects reviewed with patient 3. Recommend supportive treatment with symptom medic treatment.  I will give her Tessalon Perles for cough recommend Zyrtec and Flonase for possible seasonal allergies which this is most likely.  Told her this  does not require antibiotic therapy.   4. F/u prn if symptoms worsen or don't improve   Final Clinical Impressions(s) / UC Diagnoses   Final diagnoses:  Upper respiratory tract infection, unspecified type    ED Discharge Orders        Ordered    fluticasone (FLONASE) 50 MCG/ACT nasal spray  Daily     12/19/17 1814    benzonatate (TESSALON) 200 MG capsule     12/19/17 1814    cetirizine (ZYRTEC ALLERGY) 10 MG tablet  Daily     12/19/17 1814       Controlled Substance Prescriptions [place] Controlled Substance Registry consulted? Not Applicable   Lutricia Feil, PA-C 12/19/17 2109

## 2017-12-19 NOTE — ED Triage Notes (Signed)
C/O sore throat , cough and runny nose since yesterday. No fever reported.

## 2017-12-22 LAB — CULTURE, GROUP A STREP (THRC)

## 2018-08-12 ENCOUNTER — Other Ambulatory Visit: Payer: Self-pay

## 2018-08-12 ENCOUNTER — Encounter: Payer: Self-pay | Admitting: Emergency Medicine

## 2018-08-12 ENCOUNTER — Ambulatory Visit
Admission: EM | Admit: 2018-08-12 | Discharge: 2018-08-12 | Disposition: A | Discharge status: Home / Self Care | Payer: Managed Care, Other (non HMO) | Attending: Emergency Medicine | Admitting: Emergency Medicine

## 2018-08-12 DIAGNOSIS — M7661 Achilles tendinitis, right leg: Secondary | ICD-10-CM

## 2018-08-12 MED ORDER — MELOXICAM 15 MG PO TABS: 15.0000 mg | ORAL_TABLET | Freq: Every day | ORAL | 0 refills | Status: DC

## 2018-08-12 NOTE — Discharge Instructions (Addendum)
Purchase Silastic heel lifts for shoes use them in both shoes daily until your pain has stopped.  Sure to take the meloxicam with food.  If you are not improving follow-up with the podiatrist.  Avoid symptoms as much as possible.Apply ice 20 minutes out of every 2 hours 4-5 times daily for comfort.

## 2018-08-12 NOTE — ED Triage Notes (Signed)
Patient stated she was taking her shoe off last night and heard a pop in her right foot. She states this morning it is very painful to walk on.

## 2018-08-12 NOTE — ED Provider Notes (Signed)
[facility]    CSN: [id number] Arrival date & time: 08/12/18  [id number]     History   Chief Complaint Chief Complaint  Patient presents with  . Foot Pain    HPI Destiny Williamson is a 52 y.o. female.   HPI  52 year old female states that yesterday she was taking off her boot pushing on the boot with her other foot heard a pop in her right foot.  It is very painful to walk on.  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  Pain is over the posterior heel.  Been using an elastic wrap for support.  Is no other injury that she remembers.         Past Medical History:  Diagnosis Date  . Thyroid disease     Patient Active Problem List   Diagnosis Date Noted  . Fitting and adjustment of gastrointestinal appliance and device   . Biliary colic   . Other specified diseases of biliary tract   . Common bile duct filling defect, non-specific   . Choledocholithiasis with acute cholecystitis 01/31/2016  . Fatigue 02/17/2014  . Adiposity 02/17/2014  . Extremity pain 11/28/2011  . FOM (frequency of micturition) 11/28/2011  . Adult hypothyroidism 10/31/2011    Past Surgical History:  Procedure Laterality Date  . APPENDECTOMY    . CHOLECYSTECTOMY N/A 01/31/2016   Procedure: LAPAROSCOPIC CHOLECYSTECTOMY WITH INTRAOPERATIVE CHOLANGIOGRAM;  Surgeon: Gladis Riffleatherine L Loflin, MD;  Location: [facility];  Service: General;  Laterality: N/A;  . ENDOSCOPIC RETROGRADE CHOLANGIOPANCREATOGRAPHY (ERCP) WITH PROPOFOL N/A 02/01/2016   Procedure: ENDOSCOPIC RETROGRADE CHOLANGIOPANCREATOGRAPHY (ERCP) WITH PROPOFOL;  Surgeon: Midge Miniumarren Wohl, MD;  Location: [facility];  Service: Endoscopy;  Laterality: N/A;  . ERCP N/A 02/17/2016   Procedure: ENDOSCOPIC RETROGRADE CHOLANGIOPANCREATOGRAPHY (ERCP) stent removal;  Surgeon: Midge Miniumarren Wohl, MD;  Location: [facility];  Service: Endoscopy;  Laterality: N/A;  . TOE SURGERY      OB History   None      Home Medications     Prior to Admission medications   Medication Sig Start Date End Date Taking? Authorizing Provider  cetirizine (ZYRTEC ALLERGY) 10 MG tablet Take 1 tablet (10 mg total) by mouth daily. 12/19/17  Yes Lutricia Feiloemer, Cameryn Chrisley P, PA-C  fluticasone (FLONASE) 50 MCG/ACT nasal spray Place 2 sprays into both nostrils daily. 12/19/17  Yes Lutricia Feiloemer, Garrin Kirwan P, PA-C  levonorgestrel (MIRENA) 20 MCG/24HR IUD by Intrauterine route.   Yes [provider]  levothyroxine (SYNTHROID, LEVOTHROID) 112 MCG tablet Take 125 mcg by mouth every morning. 12/12/17  Yes [provider]  levonorgestrel (MIRENA) 20 MCG/24HR IUD 20 Intra Uterine Devices by Intrauterine route daily.    [provider]  meloxicam (MOBIC) 15 MG tablet Take 1 tablet (15 mg total) by mouth daily. 08/12/18   Lutricia Feiloemer, Elva Breaker P, PA-C    Family History History reviewed. No pertinent family history.  Social History Social History   Tobacco Use  . Smoking status: Former Games developermoker  . Smokeless tobacco: Never Used  Substance Use Topics  . Alcohol use: No    Alcohol/week: 0.0 standard drinks  . Drug use: No     Allergies   Patient has no known allergies.   Review of Systems Review of Systems  Constitutional: Positive for activity change. Negative for appetite change, chills, fatigue and fever.  Musculoskeletal: Positive for gait problem and myalgias.  All other systems reviewed and are negative.    Physical Exam Triage Vital Signs  ED Triage Vitals  Enc Vitals Group     BP 08/12/18 0814 140/77     Pulse Rate 08/12/18 0814 61     Resp 08/12/18 0814 18     Temp 08/12/18 0814 98.1 F (36.7 C)     Temp Source 08/12/18 0814 Oral     SpO2 08/12/18 0814 98 %     Weight 08/12/18 0813 230 lb (104.3 kg)     Height 08/12/18 0813 5\' 8"  (1.727 m)     Head Circumference --      Peak Flow --      Pain Score 08/12/18 0812 5     Pain Loc --      Pain Edu? --      Excl. in GC? --    No data found.  Updated Vital Signs BP  140/77 (BP Location: Right Arm)   Pulse 61   Temp 98.1 F (36.7 C) (Oral)   Resp 18   Ht 5\' 8"  (1.727 m)   Wt 230 lb (104.3 kg)   SpO2 98%   BMI 34.97 kg/m   Visual Acuity Right Eye Distance:   Left Eye Distance:   Bilateral Distance:    Right Eye Near:   Left Eye Near:    Bilateral Near:     Physical Exam  Constitutional: She is oriented to person, place, and time. She appears well-developed and well-nourished. No distress.  HENT:  Head: Normocephalic.  Eyes: Pupils are equal, round, and reactive to light. Right eye exhibits no discharge. Left eye exhibits no discharge.  Neck: Normal range of motion.  Musculoskeletal: She exhibits tenderness.  Examination of the right foot no swelling or ecchymosis present.  Patient's tenderness is sharply localized to approximately 2 to 4 cm above the Achilles insertion.  She is also tender less so over the Achilles insertion.  There is no tenderness of the calcaneus itself.  Forced dorsiflexion causes her to have discomfort.  She has a negative Thompson's test.  Neuro/ Vascular function is intact distally  Neurological: She is alert and oriented to person, place, and time.  Skin: Skin is warm and dry. She is not diaphoretic.  Psychiatric: She has a normal mood and affect. Her behavior is normal. Judgment and thought content normal.  Nursing note and vitals reviewed.    UC Treatments / Results  Labs (all labs ordered are listed, but only abnormal results are displayed) Labs Reviewed - No data to display  EKG None  Radiology No results found.  Procedures Procedures (including critical care time)  Medications Ordered in UC Medications - No data to display  Initial Impression / Assessment and Plan / UC Course  I have reviewed the triage vital signs and the nursing notes.  Pertinent labs & imaging results that were available during my care of the patient were reviewed by me and considered in my medical decision making (see chart  for details).   Treat the Achilles bursitis conservatively at this time.  Asked her to place Silastic heel lifts in both shoes and wear them all the time.  May use ice on the area 20 minutes out of every 2 hours 4-5 times daily.  About her with Mobic 15 mg that she will take on a daily basis with food.  Give her the name of a podiatrist that if she is not improving she should follow-up.   Final Clinical Impressions(s) / UC Diagnoses   Final diagnoses:  Achilles bursitis of right lower extremity  Discharge Instructions     Purchase Silastic heel lifts for shoes use them in both shoes daily until your pain has stopped.  Sure to take the meloxicam with food.  If you are not improving follow-up with the podiatrist.  Avoid symptoms as much as possible.Apply ice 20 minutes out of every 2 hours 4-5 times daily for comfort.    ED Prescriptions    Medication Sig Dispense Auth. Provider   meloxicam (MOBIC) 15 MG tablet Take 1 tablet (15 mg total) by mouth daily. 30 tablet Lutricia Feil, PA-C     Controlled Substance Prescriptions [place] Controlled Substance Registry consulted? Not Applicable   Lutricia Feil, PA-C 08/12/18 1610

## 2020-05-23 ENCOUNTER — Other Ambulatory Visit: Payer: Self-pay

## 2020-05-23 ENCOUNTER — Ambulatory Visit
Admission: EM | Admit: 2020-05-23 | Discharge: 2020-05-23 | Disposition: A | Discharge status: Home / Self Care | Payer: BC Managed Care – PPO | Attending: Physician Assistant | Admitting: Physician Assistant

## 2020-05-23 DIAGNOSIS — N3001 Acute cystitis with hematuria: Secondary | ICD-10-CM

## 2020-05-23 DIAGNOSIS — R350 Frequency of micturition: Secondary | ICD-10-CM

## 2020-05-23 DIAGNOSIS — R300 Dysuria: Secondary | ICD-10-CM

## 2020-05-23 LAB — URINALYSIS, COMPLETE (UACMP) WITH MICROSCOPIC
Bilirubin Urine: NEGATIVE
Glucose, UA: NEGATIVE mg/dL
Ketones, ur: NEGATIVE mg/dL
Nitrite: NEGATIVE
Specific Gravity, Urine: 1.01 (ref 1.005–1.030)
WBC, UA: >50 WBC/hpf (ref 0–5)
pH: 6 (ref 5.0–8.0)

## 2020-05-23 MED ORDER — NITROFURANTOIN MONOHYD MACRO 100 MG PO CAPS: 100.0000 mg | ORAL_CAPSULE | Freq: Two times a day (BID) | ORAL | 0 refills | Status: AC

## 2020-05-23 MED ORDER — PHENAZOPYRIDINE HCL 200 MG PO TABS: 200.0000 mg | ORAL_TABLET | Freq: Three times a day (TID) | ORAL | 0 refills | Status: AC | PRN

## 2020-05-23 NOTE — Discharge Instructions (Addendum)

## 2020-05-23 NOTE — ED Triage Notes (Signed)
Pt reports having frequency with urination and dysuria that began yesterday. Also reports having lower abd pain.

## 2020-05-23 NOTE — ED Provider Notes (Signed)
[facility]    CSN: [id number] Arrival date & time: 05/23/20  0802      History   Chief Complaint Chief Complaint  Patient presents with  . Urinary Tract Infection    HPI BRETTE Williamson is a 54 y.o. female.   54 y/o female presenting for urinary frequency, dysuria and lower abdominal pain since yesterday. Also has had mild lower back pain. Denies fever, fatigue, aches, hematuria, vaginal discharge. Has not taken any OTC meds. Patient has a history of appendectomy and cholecystectomy.  No other concerns today.     Past Medical History:  Diagnosis Date  . Thyroid disease     Patient Active Problem List   Diagnosis Date Noted  . Fitting and adjustment of gastrointestinal appliance and device   . Biliary colic   . Other specified diseases of biliary tract   . Common bile duct filling defect, non-specific   . Choledocholithiasis with acute cholecystitis 01/31/2016  . Fatigue 02/17/2014  . Adiposity 02/17/2014  . Extremity pain 11/28/2011  . FOM (frequency of micturition) 11/28/2011  . Adult hypothyroidism 10/31/2011    Past Surgical History:  Procedure Laterality Date  . APPENDECTOMY    . CHOLECYSTECTOMY N/A 01/31/2016   Procedure: LAPAROSCOPIC CHOLECYSTECTOMY WITH INTRAOPERATIVE CHOLANGIOGRAM;  Surgeon: Gladis Riffle, MD;  Location: [facility];  Service: General;  Laterality: N/A;  . ENDOSCOPIC RETROGRADE CHOLANGIOPANCREATOGRAPHY (ERCP) WITH PROPOFOL N/A 02/01/2016   Procedure: ENDOSCOPIC RETROGRADE CHOLANGIOPANCREATOGRAPHY (ERCP) WITH PROPOFOL;  Surgeon: Midge Minium, MD;  Location: [facility];  Service: Endoscopy;  Laterality: N/A;  . ERCP N/A 02/17/2016   Procedure: ENDOSCOPIC RETROGRADE CHOLANGIOPANCREATOGRAPHY (ERCP) stent removal;  Surgeon: Midge Minium, MD;  Location: [facility];  Service: Endoscopy;  Laterality: N/A;  . TOE SURGERY      OB History   No obstetric history on file.      Home Medications    Prior to Admission medications    Medication Sig Start Date End Date Taking? Authorizing Provider  cetirizine (ZYRTEC ALLERGY) 10 MG tablet Take 1 tablet (10 mg total) by mouth daily. 12/19/17   Lutricia Feil, PA-C  fluticasone (FLONASE) 50 MCG/ACT nasal spray Place 2 sprays into both nostrils daily. 12/19/17   Lutricia Feil, PA-C  levonorgestrel (MIRENA) 20 MCG/24HR IUD by Intrauterine route.    [provider]  levonorgestrel (MIRENA) 20 MCG/24HR IUD 20 Intra Uterine Devices by Intrauterine route daily.    [provider]  levothyroxine (SYNTHROID, LEVOTHROID) 112 MCG tablet Take 125 mcg by mouth every morning. 12/12/17   [provider]  meloxicam (MOBIC) 15 MG tablet Take 1 tablet (15 mg total) by mouth daily. 08/12/18   Lutricia Feil, PA-C  metFORMIN (GLUCOPHAGE) 500 MG tablet Take 1,000 mg by mouth 2 (two) times daily. 04/11/20   [provider]  nitrofurantoin, macrocrystal-monohydrate, (MACROBID) 100 MG capsule Take 1 capsule (100 mg total) by mouth 2 (two) times daily for 5 days. 05/23/20 05/28/20  Shirlee Latch, PA-C  phenazopyridine (PYRIDIUM) 200 MG tablet Take 1 tablet (200 mg total) by mouth 3 (three) times daily as needed for up to 2 days for pain. 05/23/20 05/25/20  Shirlee Latch, PA-C    Family History History reviewed. No pertinent family history.  Social History Social History   Tobacco Use  . Smoking status: Former Games developer  . Smokeless tobacco: Never Used  Vaping Use  . Vaping Use: Never used  Substance Use Topics  . Alcohol use: Yes    Alcohol/week:  0.0 standard drinks    Comment: occa  . Drug use: No     Allergies   Patient has no known allergies.   Review of Systems Review of Systems  Constitutional: Negative for fatigue and fever.  Gastrointestinal: Positive for abdominal pain. Negative for diarrhea, nausea and vomiting.  Genitourinary: Positive for dysuria, frequency and urgency. Negative for pelvic pain and vaginal discharge.    Musculoskeletal: Negative for back pain.  Neurological: Negative for dizziness and weakness.     Physical Exam Triage Vital Signs ED Triage Vitals  Enc Vitals Group     BP 05/23/20 0816 (!) 128/104     Pulse Rate 05/23/20 0816 64     Resp 05/23/20 0816 16     Temp 05/23/20 0816 98 F (36.7 C)     Temp src --      SpO2 05/23/20 0816 100 %     Weight 05/23/20 0817 213 lb (96.6 kg)     Height 05/23/20 0817 5\' 8"  (1.727 m)     Head Circumference --      Peak Flow --      Pain Score 05/23/20 0817 7     Pain Loc --      Pain Edu? --      Excl. in GC? --    No data found.  Updated Vital Signs BP (!) 128/104   Pulse 64   Temp 98 F (36.7 C)   Resp 16   Ht 5\' 8"  (1.727 m)   Wt 213 lb (96.6 kg)   SpO2 100%   BMI 32.39 kg/m      Physical Exam Vitals and nursing note reviewed.  Constitutional:      General: She is not in acute distress.    Appearance: Normal appearance. She is not ill-appearing or toxic-appearing.  HENT:     Head: Normocephalic and atraumatic.  Eyes:     General: No scleral icterus.       Right eye: No discharge.        Left eye: No discharge.     Conjunctiva/sclera: Conjunctivae normal.  Cardiovascular:     Rate and Rhythm: Normal rate and regular rhythm.     Heart sounds: Normal heart sounds.  Pulmonary:     Effort: Pulmonary effort is normal. No respiratory distress.     Breath sounds: Normal breath sounds.  Abdominal:     Palpations: Abdomen is soft.     Tenderness: There is abdominal tenderness (mild suprapubic TTP). There is no right CVA tenderness or left CVA tenderness.  Musculoskeletal:     Cervical back: Neck supple.  Skin:    General: Skin is dry.  Neurological:     General: No focal deficit present.     Mental Status: She is alert. Mental status is at baseline.     Motor: No weakness.     Gait: Gait normal.  Psychiatric:        Mood and Affect: Mood normal.        Behavior: Behavior normal.        Thought Content: Thought  content normal.      UC Treatments / Results  Labs (all labs ordered are listed, but only abnormal results are displayed) Labs Reviewed  URINALYSIS, COMPLETE (UACMP) WITH MICROSCOPIC - Abnormal; Notable for the following components:      Result Value   APPearance CLOUDY (*)    Hgb urine dipstick MODERATE (*)    Protein, ur TRACE (*)  Leukocytes,Ua MODERATE (*)    Bacteria, UA MANY (*)    All other components within normal limits  URINE CULTURE    EKG   Radiology No results found.  Procedures Procedures (including critical care time)  Medications Ordered in UC Medications - No data to display  Initial Impression / Assessment and Plan / UC Course  I have reviewed the triage vital signs and the nursing notes.  Pertinent labs & imaging results that were available during my care of the patient were reviewed by me and considered in my medical decision making (see chart for details).   Ua with +hgb, +leuks. Coupled with symptoms, supports diagnosis of UTI. Sending pyridium and Macrobid. Urine culture sent, will modify tx if needed pending results. Advised rest and fluids. F/u if not better with antibiotics or for worsening of symptoms.   Final Clinical Impressions(s) / UC Diagnoses   Final diagnoses:  Dysuria  Urinary frequency  Acute cystitis with hematuria     Discharge Instructions     UTI: Based on either symptoms or urinalysis, you may have a urinary tract infection. We will send the urine for culture and call with results in a few days. Begin antibiotics at this time. Your symptoms should be much improved over the next 2-3 days. Increase rest and fluid intake. If for some reason symptoms are worsening or not improving after a couple of days or the urine culture determines the antibiotics you are taking will not treat the infection, the antibiotics may be changed. Return or go to ER for fever, back pain, worsening urinary pain, discharge, increased blood in urine.  May take Tylenol or Motrin OTC for pain relief or consider AZO if no contraindications     ED Prescriptions    Medication Sig Dispense Auth. Provider   nitrofurantoin, macrocrystal-monohydrate, (MACROBID) 100 MG capsule Take 1 capsule (100 mg total) by mouth 2 (two) times daily for 5 days. 10 capsule Eusebio Friendly B, PA-C   phenazopyridine (PYRIDIUM) 200 MG tablet Take 1 tablet (200 mg total) by mouth 3 (three) times daily as needed for up to 2 days for pain. 6 tablet Gareth Morgan     PDMP not reviewed this encounter.   Shirlee Latch, PA-C 05/23/20 [phone]

## 2020-05-25 LAB — URINE CULTURE: Culture: >=100000 — AB

## 2020-06-02 ENCOUNTER — Encounter: Payer: Self-pay | Admitting: Emergency Medicine

## 2020-06-02 ENCOUNTER — Ambulatory Visit
Admission: EM | Admit: 2020-06-02 | Discharge: 2020-06-02 | Disposition: A | Discharge status: Home / Self Care | Payer: BC Managed Care – PPO | Attending: Family Medicine | Admitting: Family Medicine

## 2020-06-02 ENCOUNTER — Other Ambulatory Visit: Payer: Self-pay

## 2020-06-02 DIAGNOSIS — R350 Frequency of micturition: Secondary | ICD-10-CM | POA: Diagnosis present

## 2020-06-02 LAB — URINALYSIS, COMPLETE (UACMP) WITH MICROSCOPIC
Bilirubin Urine: NEGATIVE
Glucose, UA: NEGATIVE mg/dL
Nitrite: POSITIVE — AB
Protein, ur: 100 mg/dL — AB
Specific Gravity, Urine: 1.02 (ref 1.005–1.030)
WBC, UA: >50 WBC/hpf (ref 0–5)
pH: 5.5 (ref 5.0–8.0)

## 2020-06-02 MED ORDER — CIPROFLOXACIN HCL 500 MG PO TABS: 500.0000 mg | ORAL_TABLET | Freq: Two times a day (BID) | ORAL | 0 refills | Status: DC

## 2020-06-02 NOTE — ED Triage Notes (Signed)
Pt c/o dysuria, and lower abdominal pain. She states she was at [facility] last week and was treated. She does ot think it ever fully went away.

## 2020-06-02 NOTE — ED Provider Notes (Addendum)
[facility]    CSN: [id number] Arrival date & time: 06/02/20  1814      History   Chief Complaint Chief Complaint  Patient presents with   Dysuria    HPI Destiny Williamson is a 54 y.o. female who present with recurrent dysuria the day after finishing up Macrobid from UTI last week. She never had a UTI before and she is not sexually active with her husband. Denies abnormal vaginal discharge, fever or flank pain. Her urine culture was sensitive to Macrobid.    Past Medical History:  Diagnosis Date   Thyroid disease     Patient Active Problem List   Diagnosis Date Noted   Fitting and adjustment of gastrointestinal appliance and device    Biliary colic    Other specified diseases of biliary tract    Common bile duct filling defect, non-specific    Choledocholithiasis with acute cholecystitis 01/31/2016   Fatigue 02/17/2014   Adiposity 02/17/2014   Extremity pain 11/28/2011   FOM (frequency of micturition) 11/28/2011   Adult hypothyroidism 10/31/2011    Past Surgical History:  Procedure Laterality Date   APPENDECTOMY     CHOLECYSTECTOMY N/A 01/31/2016   Procedure: LAPAROSCOPIC CHOLECYSTECTOMY WITH INTRAOPERATIVE CHOLANGIOGRAM;  Surgeon: Gladis Riffle, MD;  Location: [facility];  Service: General;  Laterality: N/A;   ENDOSCOPIC RETROGRADE CHOLANGIOPANCREATOGRAPHY (ERCP) WITH PROPOFOL N/A 02/01/2016   Procedure: ENDOSCOPIC RETROGRADE CHOLANGIOPANCREATOGRAPHY (ERCP) WITH PROPOFOL;  Surgeon: Midge Minium, MD;  Location: [facility];  Service: Endoscopy;  Laterality: N/A;   ERCP N/A 02/17/2016   Procedure: ENDOSCOPIC RETROGRADE CHOLANGIOPANCREATOGRAPHY (ERCP) stent removal;  Surgeon: Midge Minium, MD;  Location: [facility];  Service: Endoscopy;  Laterality: N/A;   TOE SURGERY      OB History   No obstetric history on file.      Home Medications    Prior to Admission medications   Medication Sig Start Date End Date Taking? Authorizing  Provider  levothyroxine (SYNTHROID, LEVOTHROID) 112 MCG tablet Take 125 mcg by mouth every morning. 12/12/17  Yes [provider]  metFORMIN (GLUCOPHAGE) 500 MG tablet Take 1,000 mg by mouth 2 (two) times daily. 04/11/20  Yes [provider]  ciprofloxacin (CIPRO) 500 MG tablet Take 1 tablet (500 mg total) by mouth 2 (two) times daily. 06/02/20   Rodriguez-Southworth, Nettie Elm, PA-C  cetirizine (ZYRTEC ALLERGY) 10 MG tablet Take 1 tablet (10 mg total) by mouth daily. 12/19/17 06/02/20  Lutricia Feil, PA-C  fluticasone (FLONASE) 50 MCG/ACT nasal spray Place 2 sprays into both nostrils daily. 12/19/17 06/02/20  Lutricia Feil, PA-C  levonorgestrel (MIRENA) 20 MCG/24HR IUD 20 Intra Uterine Devices by Intrauterine route daily.  06/02/20  [provider]    Family History History reviewed. No pertinent family history.  Social History Social History   Tobacco Use   Smoking status: Former Smoker   Smokeless tobacco: Never Used  Building services engineer Use: Never used  Substance Use Topics   Alcohol use: Yes    Alcohol/week: 0.0 standard drinks    Comment: occa   Drug use: No     Allergies   Patient has no known allergies.   Review of Systems Review of Systems  Constitutional: Negative for chills, diaphoresis and fever.  Gastrointestinal: Negative for abdominal pain.  Genitourinary: Positive for dysuria, frequency and urgency. Negative for flank pain, hematuria, pelvic pain and vaginal discharge.  Musculoskeletal: Negative for back pain and myalgias.  Skin: Negative for rash.   Physical Exam Triage  Vital Signs ED Triage Vitals  Enc Vitals Group     BP 06/02/20 1909 (!) 115/53     Pulse Rate 06/02/20 1909 74     Resp 06/02/20 1909 18     Temp 06/02/20 1909 98.1 F (36.7 C)     Temp Source 06/02/20 1909 Oral     SpO2 06/02/20 1909 100 %     Weight 06/02/20 1906 212 lb 15.4 oz (96.6 kg)     Height 06/02/20 1906 5\' 8"  (1.727 m)     Head Circumference --        Peak Flow --      Pain Score 06/02/20 1906 5     Pain Loc --      Pain Edu? --      Excl. in GC? --    No data found.  Updated Vital Signs BP (!) 115/53 (BP Location: Right Arm)    Pulse 74    Temp 98.1 F (36.7 C) (Oral)    Resp 18    Ht 5\' 8"  (1.727 m)    Wt 212 lb 15.4 oz (96.6 kg)    SpO2 100%    BMI 32.38 kg/m   Visual Acuity Right Eye Distance:   Left Eye Distance:   Bilateral Distance:    Right Eye Near:   Left Eye Near:    Bilateral Near:     Physical Exam Constitutional:      General: She is not in acute distress.    Appearance: She is obese. She is not toxic-appearing.  Eyes:     General: No scleral icterus.    Conjunctiva/sclera: Conjunctivae normal.  Pulmonary:     Effort: Pulmonary effort is normal.  Neurological:     Mental Status: She is alert.    Alert NAD EYES- non icterus NECK -supple ABD- + BS, soft, no masses, tenderness or HSM. Neg CVA tenderness.   UC Treatments / Results  Labs (all labs ordered are listed, but only abnormal results are displayed) Labs Reviewed  URINALYSIS, COMPLETE (UACMP) WITH MICROSCOPIC - Abnormal; Notable for the following components:      Result Value   APPearance CLOUDY (*)    Hgb urine dipstick MODERATE (*)    Ketones, ur TRACE (*)    Protein, ur 100 (*)    Nitrite POSITIVE (*)    Leukocytes,Ua MODERATE (*)    Bacteria, UA MANY (*)    All other components within normal limits    EKG   Radiology No results found.  Procedures Procedures (including critical care time)  Medications Ordered in UC Medications - No data to display  Initial Impression / Assessment and Plan / UC Course  I have reviewed the triage vital signs and the nursing notes. Has unresolved UTI and this time I placed her on Cirpo for 7 days.  Pertinent labs results that were available during my care of the patient were reviewed by me and considered in my medical decision making (see chart for details). Urine culture was not  repeated.  Final Clinical Impressions(s) / UC Diagnoses   Final diagnoses:  Urinary frequency   Discharge Instructions   None    ED Prescriptions    Medication Sig Dispense Auth. Provider   ciprofloxacin (CIPRO) 500 MG tablet Take 1 tablet (500 mg total) by mouth 2 (two) times daily. 14 tablet Rodriguez-Southworth, 08/02/20, PA-C     PDMP not reviewed this encounter.   , Nettie Elm 06/02/20 1955    Rodriguez-Southworth, Cordelia Poche, PA-C  06/02/20 2115 ° °

## 2020-09-20 ENCOUNTER — Other Ambulatory Visit: Payer: Self-pay

## 2020-09-20 ENCOUNTER — Ambulatory Visit
Admission: EM | Admit: 2020-09-20 | Discharge: 2020-09-20 | Disposition: A | Discharge status: Home / Self Care | Payer: BC Managed Care – PPO | Attending: Family Medicine | Admitting: Family Medicine

## 2020-09-20 ENCOUNTER — Encounter: Payer: Self-pay | Admitting: Emergency Medicine

## 2020-09-20 DIAGNOSIS — Z87891 Personal history of nicotine dependence: Secondary | ICD-10-CM | POA: Diagnosis not present

## 2020-09-20 DIAGNOSIS — J029 Acute pharyngitis, unspecified: Secondary | ICD-10-CM | POA: Diagnosis present

## 2020-09-20 DIAGNOSIS — Z20822 Contact with and (suspected) exposure to covid-19: Secondary | ICD-10-CM | POA: Insufficient documentation

## 2020-09-20 LAB — RESP PANEL BY RT-PCR (FLU A&B, COVID) ARPGX2
Influenza A by PCR: NEGATIVE
Influenza B by PCR: NEGATIVE
SARS Coronavirus 2 by RT PCR: NEGATIVE

## 2020-09-20 NOTE — Discharge Instructions (Signed)
I will call with the results.  Take care  Dr. Fabiola Mudgett  

## 2020-09-20 NOTE — ED Triage Notes (Signed)
Patient c/o sore throat that started today. She states she has been exposed to COVID from family members in her house.

## 2020-09-20 NOTE — ED Provider Notes (Signed)
[facility]    CSN: [id number] Arrival date & time: 09/20/20  1710      History   Chief Complaint Chief Complaint  Patient presents with  . Covid Exposure  . Sore Throat   HPI  54 year old female presents with sore throat.  Patient reports a mild sore throat that started today.  She has recently been exposed to COVID-19 as her daughter-in-law lives with her and has tested positive.  She desires Covid testing today.  She denies any other symptoms.  Pain is mild currently 2/10 in severity.  No other associated symptoms.  No other complaints.  Past Medical History:  Diagnosis Date  . Thyroid disease    Patient Active Problem List   Diagnosis Date Noted  . Fitting and adjustment of gastrointestinal appliance and device   . Biliary colic   . Other specified diseases of biliary tract   . Common bile duct filling defect, non-specific   . Choledocholithiasis with acute cholecystitis 01/31/2016  . Fatigue 02/17/2014  . Adiposity 02/17/2014  . Extremity pain 11/28/2011  . FOM (frequency of micturition) 11/28/2011  . Adult hypothyroidism 10/31/2011    Past Surgical History:  Procedure Laterality Date  . APPENDECTOMY    . CHOLECYSTECTOMY N/A 01/31/2016   Procedure: LAPAROSCOPIC CHOLECYSTECTOMY WITH INTRAOPERATIVE CHOLANGIOGRAM;  Surgeon: Gladis Riffle, MD;  Location: [facility];  Service: General;  Laterality: N/A;  . ENDOSCOPIC RETROGRADE CHOLANGIOPANCREATOGRAPHY (ERCP) WITH PROPOFOL N/A 02/01/2016   Procedure: ENDOSCOPIC RETROGRADE CHOLANGIOPANCREATOGRAPHY (ERCP) WITH PROPOFOL;  Surgeon: Midge Minium, MD;  Location: [facility];  Service: Endoscopy;  Laterality: N/A;  . ERCP N/A 02/17/2016   Procedure: ENDOSCOPIC RETROGRADE CHOLANGIOPANCREATOGRAPHY (ERCP) stent removal;  Surgeon: Midge Minium, MD;  Location: [facility];  Service: Endoscopy;  Laterality: N/A;  . TOE SURGERY      OB History   No obstetric history on file.      Home Medications    Prior  to Admission medications   Medication Sig Start Date End Date Taking? Authorizing Provider  levothyroxine (SYNTHROID, LEVOTHROID) 112 MCG tablet Take 125 mcg by mouth every morning. 12/12/17  Yes [provider]  metFORMIN (GLUCOPHAGE) 500 MG tablet Take 1,000 mg by mouth 2 (two) times daily. 04/11/20  Yes [provider]  cetirizine (ZYRTEC ALLERGY) 10 MG tablet Take 1 tablet (10 mg total) by mouth daily. 12/19/17 06/02/20  Lutricia Feil, PA-C  fluticasone (FLONASE) 50 MCG/ACT nasal spray Place 2 sprays into both nostrils daily. 12/19/17 06/02/20  Lutricia Feil, PA-C  levonorgestrel (MIRENA) 20 MCG/24HR IUD 20 Intra Uterine Devices by Intrauterine route daily.  06/02/20  [provider]    Family History History reviewed. No pertinent family history.  Social History Social History   Tobacco Use  . Smoking status: Former Games developer  . Smokeless tobacco: Never Used  Vaping Use  . Vaping Use: Never used  Substance Use Topics  . Alcohol use: Yes    Alcohol/week: 0.0 standard drinks    Comment: occa  . Drug use: No     Allergies   Patient has no known allergies.   Review of Systems Review of Systems  Constitutional: Negative for fever.  HENT: Positive for sore throat.    Physical Exam Triage Vital Signs ED Triage Vitals  Enc Vitals Group     BP 09/20/20 1737 135/78     Pulse Rate 09/20/20 1737 63     Resp 09/20/20 1737 18     Temp 09/20/20 1737 98.1 F (36.7 C)  Temp Source 09/20/20 1737 Oral     SpO2 09/20/20 1737 100 %     Weight 09/20/20 1738 210 lb (95.3 kg)     Height 09/20/20 1738 5\' 8"  (1.727 m)     Head Circumference --      Peak Flow --      Pain Score 09/20/20 1737 0     Pain Loc --      Pain Edu? --      Excl. in GC? --    Updated Vital Signs BP 135/78 (BP Location: Right Arm)   Pulse 63   Temp 98.1 F (36.7 C) (Oral)   Resp 18   Ht 5\' 8"  (1.727 m)   Wt 95.3 kg   SpO2 100%   BMI 31.93 kg/m   Visual Acuity Right Eye  Distance:   Left Eye Distance:   Bilateral Distance:    Right Eye Near:   Left Eye Near:    Bilateral Near:     Physical Exam Vitals and nursing note reviewed.  Constitutional:      General: She is not in acute distress.    Appearance: Normal appearance. She is not ill-appearing.  HENT:     Mouth/Throat:     Pharynx: Posterior oropharyngeal erythema present. No oropharyngeal exudate.  Eyes:     General:        Right eye: No discharge.        Left eye: No discharge.     Conjunctiva/sclera: Conjunctivae normal.  Cardiovascular:     Rate and Rhythm: Normal rate and regular rhythm.  Pulmonary:     Effort: Pulmonary effort is normal.     Breath sounds: Normal breath sounds. No wheezing, rhonchi or rales.  Neurological:     Mental Status: She is alert.  Psychiatric:        Mood and Affect: Mood normal.        Behavior: Behavior normal.    UC Treatments / Results  Labs (all labs ordered are listed, but only abnormal results are displayed) Labs Reviewed  RESP PANEL BY RT-PCR (FLU A&B, COVID) ARPGX2    EKG   Radiology No results found.  Procedures Procedures (including critical care time)  Medications Ordered in UC Medications - No data to display  Initial Impression / Assessment and Plan / UC Course  I have reviewed the triage vital signs and the nursing notes.  Pertinent labs & imaging results that were available during my care of the patient were reviewed by me and considered in my medical decision making (see chart for details).    54 year old female presents with viral pharyngitis.  Covid testing is negative today.  Her exam is benign.  Do not suspect strep pharyngitis.  Supportive care.  Over-the-counter analgesics.  Final Clinical Impressions(s) / UC Diagnoses   Final diagnoses:  Acute pharyngitis, unspecified etiology     Discharge Instructions     I will call with the results.  Take care  Dr.    ED Prescriptions    None     PDMP not  reviewed this encounter.   57, Adriana Simas 09/20/20 [phone]

## 2020-09-26 ENCOUNTER — Ambulatory Visit: Payer: Self-pay

## 2020-09-26 ENCOUNTER — Ambulatory Visit
Admission: EM | Admit: 2020-09-26 | Discharge: 2020-09-26 | Disposition: A | Discharge status: Home / Self Care | Payer: BC Managed Care – PPO | Attending: Family Medicine | Admitting: Family Medicine

## 2020-09-26 ENCOUNTER — Other Ambulatory Visit: Payer: Self-pay

## 2020-09-26 DIAGNOSIS — U071 COVID-19: Secondary | ICD-10-CM

## 2020-09-26 LAB — RESP PANEL BY RT-PCR (FLU A&B, COVID) ARPGX2
Influenza A by PCR: NEGATIVE
Influenza B by PCR: NEGATIVE
SARS Coronavirus 2 by RT PCR: POSITIVE — AB

## 2020-09-26 NOTE — Discharge Instructions (Signed)
I will call with the results.  Take care  Dr. Gabriele Loveland  

## 2020-09-26 NOTE — ED Provider Notes (Signed)
[facility]    CSN: [id number] Arrival date & time: 09/26/20  1827  History   Chief Complaint Chief Complaint  Patient presents with   Cough   HPI   54 year old female presents with cough, runny nose, chills.  Symptoms x2 days.  Husband positive for COVID-19.  She reports cough, runny nose, chills.  No other reported symptoms.  She reports ongoing sore throat as well.  No relieving factors.  Denies fatigue and other associated respiratory symptoms.  No other complaints or concerns at this time.  Past Medical History:  Diagnosis Date   Thyroid disease     Patient Active Problem List   Diagnosis Date Noted   Fitting and adjustment of gastrointestinal appliance and device    Biliary colic    Other specified diseases of biliary tract    Common bile duct filling defect, non-specific    Choledocholithiasis with acute cholecystitis 01/31/2016   Fatigue 02/17/2014   Adiposity 02/17/2014   Extremity pain 11/28/2011   FOM (frequency of micturition) 11/28/2011   Adult hypothyroidism 10/31/2011    Past Surgical History:  Procedure Laterality Date   APPENDECTOMY     CHOLECYSTECTOMY N/A 01/31/2016   Procedure: LAPAROSCOPIC CHOLECYSTECTOMY WITH INTRAOPERATIVE CHOLANGIOGRAM;  Surgeon: Gladis Riffle, MD;  Location: [facility];  Service: General;  Laterality: N/A;   ENDOSCOPIC RETROGRADE CHOLANGIOPANCREATOGRAPHY (ERCP) WITH PROPOFOL N/A 02/01/2016   Procedure: ENDOSCOPIC RETROGRADE CHOLANGIOPANCREATOGRAPHY (ERCP) WITH PROPOFOL;  Surgeon: Midge Minium, MD;  Location: [facility];  Service: Endoscopy;  Laterality: N/A;   ERCP N/A 02/17/2016   Procedure: ENDOSCOPIC RETROGRADE CHOLANGIOPANCREATOGRAPHY (ERCP) stent removal;  Surgeon: Midge Minium, MD;  Location: [facility];  Service: Endoscopy;  Laterality: N/A;   TOE SURGERY      OB History   No obstetric history on file.      Home Medications    Prior to Admission medications   Medication Sig  Start Date End Date Taking? Authorizing Provider  levothyroxine (SYNTHROID, LEVOTHROID) 112 MCG tablet Take 125 mcg by mouth every morning. 12/12/17  Yes [provider]  metFORMIN (GLUCOPHAGE) 500 MG tablet Take 1,000 mg by mouth 2 (two) times daily. 04/11/20  Yes [provider]  cetirizine (ZYRTEC ALLERGY) 10 MG tablet Take 1 tablet (10 mg total) by mouth daily. 12/19/17 06/02/20  Lutricia Feil, PA-C  fluticasone (FLONASE) 50 MCG/ACT nasal spray Place 2 sprays into both nostrils daily. 12/19/17 06/02/20  Lutricia Feil, PA-C  levonorgestrel (MIRENA) 20 MCG/24HR IUD 20 Intra Uterine Devices by Intrauterine route daily.  06/02/20  [provider]   Social History Social History   Tobacco Use   Smoking status: Former Smoker   Smokeless tobacco: Never Used  Building services engineer Use: Never used  Substance Use Topics   Alcohol use: Yes    Alcohol/week: 0.0 standard drinks    Comment: occa   Drug use: No     Allergies   Patient has no known allergies.   Review of Systems Review of Systems  Constitutional: Positive for chills.  HENT: Positive for rhinorrhea.   Respiratory: Positive for cough.    Physical Exam Triage Vital Signs ED Triage Vitals  Enc Vitals Group     BP 09/26/20 2006 123/82     Pulse Rate 09/26/20 2006 95     Resp 09/26/20 2006 18     Temp 09/26/20 2006 98.7 F (37.1 C)     Temp Source 09/26/20 2006 Oral     SpO2 09/26/20 2006 98 %  Weight 09/26/20 2004 210 lb (95.3 kg)     Height 09/26/20 2004 5\' 8"  (1.727 m)     Head Circumference --      Peak Flow --      Pain Score 09/26/20 2004 2     Pain Loc --      Pain Edu? --      Excl. in GC? --    No data found.  Updated Vital Signs BP 123/82 (BP Location: Left Arm)    Pulse 95    Temp 98.7 F (37.1 C) (Oral)    Resp 18    Ht 5\' 8"  (1.727 m)    Wt 95.3 kg    SpO2 98%    BMI 31.93 kg/m   Visual Acuity Right Eye Distance:   Left Eye Distance:   Bilateral Distance:     Right Eye Near:   Left Eye Near:    Bilateral Near:     Physical Exam Constitutional:      General: She is not in acute distress.    Appearance: Normal appearance. She is obese. She is not ill-appearing.  HENT:     Head: Normocephalic and atraumatic.  Eyes:     General:        Right eye: No discharge.        Left eye: No discharge.     Conjunctiva/sclera: Conjunctivae normal.  Cardiovascular:     Rate and Rhythm: Normal rate and regular rhythm.     Heart sounds: No murmur heard.   Pulmonary:     Effort: Pulmonary effort is normal.     Breath sounds: Normal breath sounds. No wheezing, rhonchi or rales.  Neurological:     Mental Status: She is alert.  Psychiatric:        Mood and Affect: Mood normal.        Behavior: Behavior normal.    UC Treatments / Results  Labs (all labs ordered are listed, but only abnormal results are displayed) Labs Reviewed  RESP PANEL BY RT-PCR (FLU A&B, COVID) ARPGX2 - Abnormal; Notable for the following components:      Result Value   SARS Coronavirus 2 by RT PCR POSITIVE (*)    All other components within normal limits    EKG   Radiology No results found.  Procedures Procedures (including critical care time)  Medications Ordered in UC Medications - No data to display  Initial Impression / Assessment and Plan / UC Course  I have reviewed the triage vital signs and the nursing notes.  Pertinent labs & imaging results that were available during my care of the patient were reviewed by me and considered in my medical decision making (see chart for details).    54 year old female presents with COVID-19.  Recent exposure.  Well-appearing on exam.  Information sent to infusion clinic.  Supportive care.  Final Clinical Impressions(s) / UC Diagnoses   Final diagnoses:  COVID     Discharge Instructions     I will call with the results.  Take care  Dr.     ED Prescriptions    None     PDMP not reviewed this  encounter.   57, Adriana Simas 09/26/20 2119

## 2020-09-26 NOTE — ED Triage Notes (Signed)
Patient complains of cough and runny nose with chills x 2 days. Patient husband is positive for covid.

## 2020-11-06 ENCOUNTER — Ambulatory Visit
Admission: EM | Admit: 2020-11-06 | Discharge: 2020-11-06 | Disposition: A | Discharge status: Home / Self Care | Payer: BC Managed Care – PPO | Attending: Sports Medicine | Admitting: Sports Medicine

## 2020-11-06 ENCOUNTER — Encounter: Payer: Self-pay | Admitting: Emergency Medicine

## 2020-11-06 ENCOUNTER — Other Ambulatory Visit: Payer: Self-pay

## 2020-11-06 DIAGNOSIS — M7711 Lateral epicondylitis, right elbow: Secondary | ICD-10-CM | POA: Diagnosis not present

## 2020-11-06 DIAGNOSIS — M79601 Pain in right arm: Secondary | ICD-10-CM

## 2020-11-06 MED ORDER — DICLOFENAC SODIUM 75 MG PO TBEC: 75.0000 mg | DELAYED_RELEASE_TABLET | Freq: Two times a day (BID) | ORAL | 0 refills | Status: DC

## 2020-11-06 NOTE — Discharge Instructions (Addendum)
You have been diagnosed with tennis elbow which is also called lateral epicondylitis. I have given you an anti-inflammatory.  Please do not take any Motrin Advil ibuprofen Naprosyn or Aleve.  You can take Tylenol. Educational handout was provided. I have also given you the name of an orthopedic physician that you can see for evaluation and possible referral for physical therapy.  I hope you get to feeling better, Dr. Mal Amabile

## 2020-11-06 NOTE — ED Triage Notes (Signed)
Patient states that she has started weight training. Patient states that she fell several months ago and injured her right arm.  Patient c/o pain in her right lower arm that has gotten worse over the past week.

## 2020-11-06 NOTE — ED Provider Notes (Signed)
[facility]    CSN: [id number] Arrival date & time: 11/06/20  7169      History   Chief Complaint Chief Complaint  Patient presents with  . Arm Pain    right    HPI Destiny Williamson is a 55 y.o. female.   Pleasant 55 year old right-hand-dominant female who presents for evaluation of the above issues.  She reports a remote history of a fall and she injured her right arm in the summer when she was in [place].  She went to the emergency room and had x-rays and was told that was negative with no follow-up.  She did get some swelling after the fall.  What concerns her is that she started developing swelling over the past 7 to 10 days and the same area.  Her pain is over the lateral aspect of her elbow going into the dorsal aspect of her forearm.  She started going to the gym back in October and has been having a nagging type of intermittent discomfort in that area but in the last 7 to 10 days is gotten worse.  No acute accidents trauma or falls.  No fever shakes chills.  She is having discomfort with wrist extension and lifting objects that are quite heavy.  No neck pain numbness or tingling.  No red flag signs or symptoms elicited on history.     Past Medical History:  Diagnosis Date  . Thyroid disease     Patient Active Problem List   Diagnosis Date Noted  . Fitting and adjustment of gastrointestinal appliance and device   . Biliary colic   . Other specified diseases of biliary tract   . Common bile duct filling defect, non-specific   . Choledocholithiasis with acute cholecystitis 01/31/2016  . Fatigue 02/17/2014  . Adiposity 02/17/2014  . Extremity pain 11/28/2011  . FOM (frequency of micturition) 11/28/2011  . Adult hypothyroidism 10/31/2011    Past Surgical History:  Procedure Laterality Date  . APPENDECTOMY    . CHOLECYSTECTOMY N/A 01/31/2016   Procedure: LAPAROSCOPIC CHOLECYSTECTOMY WITH INTRAOPERATIVE CHOLANGIOGRAM;  Surgeon: Gladis Riffle, MD;   Location: [facility];  Service: General;  Laterality: N/A;  . ENDOSCOPIC RETROGRADE CHOLANGIOPANCREATOGRAPHY (ERCP) WITH PROPOFOL N/A 02/01/2016   Procedure: ENDOSCOPIC RETROGRADE CHOLANGIOPANCREATOGRAPHY (ERCP) WITH PROPOFOL;  Surgeon: Midge Minium, MD;  Location: [facility];  Service: Endoscopy;  Laterality: N/A;  . ERCP N/A 02/17/2016   Procedure: ENDOSCOPIC RETROGRADE CHOLANGIOPANCREATOGRAPHY (ERCP) stent removal;  Surgeon: Midge Minium, MD;  Location: [facility];  Service: Endoscopy;  Laterality: N/A;  . TOE SURGERY      OB History   No obstetric history on file.      Home Medications    Prior to Admission medications   Medication Sig Start Date End Date Taking? Authorizing Provider  diclofenac (VOLTAREN) 75 MG EC tablet Take 1 tablet (75 mg total) by mouth 2 (two) times daily. Take with food.  Do not take any other anti-inflammatories with this medication including Advil, Motrin, ibuprofen, or Aleve. 11/06/20  Yes Delton See, MD  levothyroxine (SYNTHROID, LEVOTHROID) 112 MCG tablet Take 125 mcg by mouth every morning. 12/12/17  Yes [provider]  metFORMIN (GLUCOPHAGE) 500 MG tablet Take 1,000 mg by mouth 2 (two) times daily. 04/11/20  Yes [provider]  topiramate (TOPAMAX) 25 MG tablet TAKE 1 TABLET BY MOUTH EVERY DAY AT NIGHT 11/03/20  Yes [provider]  alendronate (FOSAMAX) 70 MG tablet Take 70 mg by mouth once a week. 10/27/20  [provider]  cetirizine (ZYRTEC ALLERGY) 10 MG tablet Take 1 tablet (10 mg total) by mouth daily. 12/19/17 06/02/20  Lutricia Feil, PA-C  fluticasone (FLONASE) 50 MCG/ACT nasal spray Place 2 sprays into both nostrils daily. 12/19/17 06/02/20  Lutricia Feil, PA-C  levonorgestrel (MIRENA) 20 MCG/24HR IUD 20 Intra Uterine Devices by Intrauterine route daily.  06/02/20  [provider]    Family History History reviewed. No pertinent family history.  Social History Social History   Tobacco Use  .  Smoking status: Former Games developer  . Smokeless tobacco: Never Used  Vaping Use  . Vaping Use: Never used  Substance Use Topics  . Alcohol use: Yes    Alcohol/week: 0.0 standard drinks    Comment: occa  . Drug use: No     Allergies   Patient has no known allergies.   Review of Systems Review of Systems  Constitutional: Negative.   HENT: Negative.   Eyes: Negative.   Respiratory: Negative.   Cardiovascular: Negative.   Gastrointestinal: Negative.   Genitourinary: Negative.   Musculoskeletal: Positive for arthralgias and joint swelling. Negative for back pain, myalgias, neck pain and neck stiffness.  Skin: Negative.   Neurological: Negative.   All other systems reviewed and are negative.    Physical Exam Triage Vital Signs ED Triage Vitals  Enc Vitals Group     BP 11/06/20 0958 125/70     Pulse Rate 11/06/20 0958 60     Resp 11/06/20 0958 14     Temp 11/06/20 0958 98.4 F (36.9 C)     Temp Source 11/06/20 0958 Oral     SpO2 11/06/20 0958 100 %     Weight 11/06/20 0955 207 lb (93.9 kg)     Height 11/06/20 0955 5\' 8"  (1.727 m)     Head Circumference --      Peak Flow --      Pain Score 11/06/20 0955 7     Pain Loc --      Pain Edu? --      Excl. in GC? --    No data found.  Updated Vital Signs BP 125/70 (BP Location: Left Arm)   Pulse 60   Temp 98.4 F (36.9 C) (Oral)   Resp 14   Ht 5\' 8"  (1.727 m)   Wt 93.9 kg   SpO2 100%   BMI 31.47 kg/m   Visual Acuity Right Eye Distance:   Left Eye Distance:   Bilateral Distance:    Right Eye Near:   Left Eye Near:    Bilateral Near:     Physical Exam Vitals and nursing note reviewed.  Constitutional:      General: She is not in acute distress.    Appearance: Normal appearance. She is not ill-appearing or toxic-appearing.  HENT:     Head: Normocephalic and atraumatic.  Musculoskeletal:     Comments: Left elbow and forearm: Normal to inspection palpation range of motion special test  Right elbow and  forearm: No obvious bony abnormality ecchymosis erythema or soft tissue swelling.  She has tenderness to palpation over the lateral epicondyle and it extends into the tendon sheath of the extensor mechanism.  She has full active range of motion.  No tenderness over the medial epicondyle.  No involvement of the ulnar nerve.  She has discomfort with wrist extension.  This is accentuated with resisted wrist extension.  Her grip strength is well-preserved.  There is no evidence of any tendon retraction.  No  evidence of any intersection syndrome.  Neurovascular: Normal sensation 2+ pulses distally.  Skin:    General: Skin is warm and dry.     Capillary Refill: Capillary refill takes less than 2 seconds.  Neurological:     General: No focal deficit present.     Mental Status: She is alert and oriented to person, place, and time.      UC Treatments / Results  Labs (all labs ordered are listed, but only abnormal results are displayed) Labs Reviewed - No data to display  EKG   Radiology No results found.  Procedures Procedures (including critical care time)  Medications Ordered in UC Medications - No data to display  Initial Impression / Assessment and Plan / UC Course  I have reviewed the triage vital signs and the nursing notes.  Pertinent labs & imaging results that were available during my care of the patient were reviewed by me and considered in my medical decision making (see chart for details).  Clinical impression: Right lateral elbow pain extending into the extensor tendon sheath.  It is consistent with lateral epicondylitis.  Treatment plan: 1.  The findings and treatment plan were discussed in detail with the patient.  Patient was in agreement. 2.  X-rays are necessary today. 3.  Educational handout was provided. 4.  We will do a trial of Voltaren 75 mg twice a day with food. 5.  Activity modification icing several times a day. 6.  Asked her to purchase a wrist brace to  try to keep her wrist in neutral so that she does not go into extension.  I do not want her to rely on that though. 7.  We will make an Ortho referral to see if there is anything further that can be done including possibly a cortisone injection.  She would benefit from physical therapy as well. 8.  Just supportive care for now and will see her back as needed.    Final Clinical Impressions(s) / UC Diagnoses   Final diagnoses:  Right arm pain  Lateral epicondylitis of right elbow     Discharge Instructions     You have been diagnosed with tennis elbow which is also called lateral epicondylitis. I have given you an anti-inflammatory.  Please do not take any Motrin Advil ibuprofen Naprosyn or Aleve.  You can take Tylenol. Educational handout was provided. I have also given you the name of an orthopedic physician that you can see for evaluation and possible referral for physical therapy.  I hope you get to feeling better, Dr. Mal Amabile    ED Prescriptions    Medication Sig Dispense Auth. Provider   diclofenac (VOLTAREN) 75 MG EC tablet Take 1 tablet (75 mg total) by mouth 2 (two) times daily. Take with food.  Do not take any other anti-inflammatories with this medication including Advil, Motrin, ibuprofen, or Aleve. 30 tablet Delton See, MD     PDMP not reviewed this encounter.   Delton See, MD 11/06/20 1037

## 2022-02-11 ENCOUNTER — Other Ambulatory Visit: Payer: Self-pay

## 2022-02-11 ENCOUNTER — Ambulatory Visit
Admission: EM | Admit: 2022-02-11 | Discharge: 2022-02-11 | Disposition: A | Discharge status: Home / Self Care | Payer: BC Managed Care – PPO | Attending: Physician Assistant | Admitting: Physician Assistant

## 2022-02-11 ENCOUNTER — Encounter: Payer: Self-pay | Admitting: Emergency Medicine

## 2022-02-11 DIAGNOSIS — N3001 Acute cystitis with hematuria: Secondary | ICD-10-CM | POA: Diagnosis present

## 2022-02-11 DIAGNOSIS — B3731 Acute candidiasis of vulva and vagina: Secondary | ICD-10-CM | POA: Insufficient documentation

## 2022-02-11 DIAGNOSIS — R300 Dysuria: Secondary | ICD-10-CM | POA: Diagnosis present

## 2022-02-11 LAB — URINALYSIS, MICROSCOPIC (REFLEX)

## 2022-02-11 LAB — URINALYSIS, ROUTINE W REFLEX MICROSCOPIC
Bilirubin Urine: NEGATIVE
Glucose, UA: NEGATIVE mg/dL
Ketones, ur: NEGATIVE mg/dL
Nitrite: NEGATIVE
Protein, ur: 100 mg/dL — AB
Specific Gravity, Urine: 1.015 (ref 1.005–1.030)
pH: 6.5 (ref 5.0–8.0)

## 2022-02-11 MED ORDER — NITROFURANTOIN MONOHYD MACRO 100 MG PO CAPS
100.0000 mg | ORAL_CAPSULE | Freq: Two times a day (BID) | ORAL | 0 refills | Status: DC
Start: 2022-02-11 — End: 2022-03-23

## 2022-02-11 MED ORDER — PHENAZOPYRIDINE HCL 200 MG PO TABS: 200.0000 mg | ORAL_TABLET | Freq: Three times a day (TID) | ORAL | 0 refills | Status: DC

## 2022-02-11 MED ORDER — FLUCONAZOLE 150 MG PO TABS: 150.0000 mg | ORAL_TABLET | Freq: Once | ORAL | 0 refills | Status: AC

## 2022-02-11 NOTE — ED Provider Notes (Addendum)
?[facility] ? ? ? ?CSN: [id number] ?Arrival date & time: 02/11/22  0801 ? ? ?  ? ?History   ?Chief Complaint ?Chief Complaint  ?Patient presents with  ? Dysuria  ? Abdominal Pain  ? ? ?HPI ?Destiny Williamson is a 56 y.o. female presenting for approximately 3-day history of dysuria, frequency and urgency as well as suprapubic pain.  Patient says yesterday she noticed blood in the urine.  Believes she may have a UTI she has had UTIs in the past.  She has not had any fevers or flank pain.  Has had some vaginal itching without discharge.  No other complaints. ? ?HPI ? ?Past Medical History:  ?Diagnosis Date  ? Thyroid disease   ? ? ?Patient Active Problem List  ? Diagnosis Date Noted  ? Dysuria 02/11/2022  ? Fitting and adjustment of gastrointestinal appliance and device   ? Biliary colic   ? Other specified diseases of biliary tract   ? Common bile duct filling defect, non-specific   ? Choledocholithiasis with acute cholecystitis 01/31/2016  ? Fatigue 02/17/2014  ? Adiposity 02/17/2014  ? Extremity pain 11/28/2011  ? FOM (frequency of micturition) 11/28/2011  ? Adult hypothyroidism 10/31/2011  ? ? ?Past Surgical History:  ?Procedure Laterality Date  ? APPENDECTOMY    ? CHOLECYSTECTOMY N/A 01/31/2016  ? Procedure: LAPAROSCOPIC CHOLECYSTECTOMY WITH INTRAOPERATIVE CHOLANGIOGRAM;  Surgeon: Gladis Riffleatherine L Loflin, MD;  Location: [facility];  Service: General;  Laterality: N/A;  ? ENDOSCOPIC RETROGRADE CHOLANGIOPANCREATOGRAPHY (ERCP) WITH PROPOFOL N/A 02/01/2016  ? Procedure: ENDOSCOPIC RETROGRADE CHOLANGIOPANCREATOGRAPHY (ERCP) WITH PROPOFOL;  Surgeon: Midge Miniumarren Wohl, MD;  Location: [facility];  Service: Endoscopy;  Laterality: N/A;  ? ERCP N/A 02/17/2016  ? Procedure: ENDOSCOPIC RETROGRADE CHOLANGIOPANCREATOGRAPHY (ERCP) stent removal;  Surgeon: Midge Miniumarren Wohl, MD;  Location: [facility];  Service: Endoscopy;  Laterality: N/A;  ? TOE SURGERY    ? ? ?OB History   ?No obstetric history on file. ?  ? ? ? ?Home Medications    ? ?Prior to Admission medications   ?Medication Sig Start Date End Date Taking? Authorizing Provider  ?alendronate (FOSAMAX) 70 MG tablet Take 70 mg by mouth once a week. 10/27/20  Yes [provider]  ?fluconazole (DIFLUCAN) 150 MG tablet Take 1 tablet (150 mg total) by mouth once for 1 dose. 02/11/22 02/11/22 Yes Shirlee LatchEaves, Hitesh Fouche B, PA-C  ?levothyroxine (SYNTHROID, LEVOTHROID) 112 MCG tablet Take 125 mcg by mouth every morning. 12/12/17  Yes [provider]  ?metFORMIN (GLUCOPHAGE) 500 MG tablet Take 1,000 mg by mouth 2 (two) times daily. 04/11/20  Yes [provider]  ?nitrofurantoin, macrocrystal-monohydrate, (MACROBID) 100 MG capsule Take 1 capsule (100 mg total) by mouth 2 (two) times daily. 02/11/22  Yes Shirlee LatchEaves, Alonso Gapinski B, PA-C  ?phenazopyridine (PYRIDIUM) 200 MG tablet Take 1 tablet (200 mg total) by mouth 3 (three) times daily. 02/11/22  Yes Eusebio FriendlyEaves, Destyn Schuyler B, PA-C  ?topiramate (TOPAMAX) 25 MG tablet TAKE 1 TABLET BY MOUTH EVERY DAY AT NIGHT 11/03/20  Yes [provider]  ?diclofenac (VOLTAREN) 75 MG EC tablet Take 1 tablet (75 mg total) by mouth 2 (two) times daily. Take with food.  Do not take any other anti-inflammatories with this medication including Advil, Motrin, ibuprofen, or Aleve. 11/06/20   Delton SeeBarnes, Kenneth, MD  ?cetirizine (ZYRTEC ALLERGY) 10 MG tablet Take 1 tablet (10 mg total) by mouth daily. 12/19/17 06/02/20  Lutricia Feiloemer, William P, PA-C  ?fluticasone (FLONASE) 50 MCG/ACT nasal spray Place 2 sprays into both nostrils daily. 12/19/17 06/02/20  Lutricia Feil, PA-C  ?levonorgestrel (MIRENA) 20 MCG/24HR IUD 20 Intra Uterine Devices by Intrauterine route daily.  06/02/20  [provider]  ? ? ?Family History ?History reviewed. No pertinent family history. ? ?Social History ?Social History  ? ?Tobacco Use  ? Smoking status: Former  ? Smokeless tobacco: Never  ?Vaping Use  ? Vaping Use: Never used  ?Substance Use Topics  ? Alcohol use: Yes  ?  Alcohol/week: 0.0 standard drinks  ?   Comment: occa  ? Drug use: No  ? ? ? ?Allergies   ?Patient has no known allergies. ? ? ?Review of Systems ?Review of Systems  ?Constitutional:  Negative for chills, fatigue and fever.  ?Gastrointestinal:  Positive for abdominal pain. Negative for diarrhea, nausea and vomiting.  ?Genitourinary:  Positive for dysuria, frequency, hematuria and urgency. Negative for decreased urine volume, flank pain, pelvic pain, vaginal bleeding, vaginal discharge and vaginal pain.  ?Musculoskeletal:  Negative for back pain.  ?Skin:  Negative for rash.  ? ? ?Physical Exam ?Triage Vital Signs ?ED Triage Vitals  ?Enc Vitals Group  ?   BP 02/11/22 0824 115/65  ?   Pulse Rate 02/11/22 0824 75  ?   Resp 02/11/22 0824 14  ?   Temp 02/11/22 0824 98.4 ?F (36.9 ?C)  ?   Temp Source 02/11/22 0824 Oral  ?   SpO2 02/11/22 0824 99 %  ?   Weight 02/11/22 0821 213 lb (96.6 kg)  ?   Height 02/11/22 0821 5\' 8"  (1.727 m)  ?   Head Circumference --   ?   Peak Flow --   ?   Pain Score 02/11/22 0821 4  ?   Pain Loc --   ?   Pain Edu? --   ?   Excl. in GC? --   ? ?No data found. ? ?Updated Vital Signs ?BP 115/65 (BP Location: Left Arm)   Pulse 75   Temp 98.4 ?F (36.9 ?C) (Oral)   Resp 14   Ht 5\' 8"  (1.727 m)   Wt 213 lb (96.6 kg)   SpO2 99%   BMI 32.39 kg/m?  ?   ? ?Physical Exam ?Vitals and nursing note reviewed.  ?Constitutional:   ?   General: She is not in acute distress. ?   Appearance: Normal appearance. She is not ill-appearing or toxic-appearing.  ?HENT:  ?   Head: Normocephalic and atraumatic.  ?Eyes:  ?   General: No scleral icterus.    ?   Right eye: No discharge.     ?   Left eye: No discharge.  ?   Conjunctiva/sclera: Conjunctivae normal.  ?Cardiovascular:  ?   Rate and Rhythm: Normal rate and regular rhythm.  ?   Heart sounds: Normal heart sounds.  ?Pulmonary:  ?   Effort: Pulmonary effort is normal. No respiratory distress.  ?   Breath sounds: Normal breath sounds.  ?Abdominal:  ?   Palpations: Abdomen is soft.  ?   Tenderness: There  is no abdominal tenderness. There is no right CVA tenderness or left CVA tenderness.  ?Musculoskeletal:  ?   Cervical back: Neck supple.  ?Skin: ?   General: Skin is dry.  ?Neurological:  ?   General: No focal deficit present.  ?   Mental Status: She is alert. Mental status is at baseline.  ?   Motor: No weakness.  ?   Gait: Gait normal.  ?Psychiatric:     ?   Mood and Affect: Mood  normal.     ?   Behavior: Behavior normal.     ?   Thought Content: Thought content normal.  ? ? ? ?UC Treatments / Results  ?Labs ?(all labs ordered are listed, but only abnormal results are displayed) ?Labs Reviewed  ?URINALYSIS, ROUTINE W REFLEX MICROSCOPIC - Abnormal; Notable for the following components:  ?    Result Value  ? Hgb urine dipstick MODERATE (*)   ? Protein, ur 100 (*)   ? Leukocytes,Ua SMALL (*)   ? All other components within normal limits  ?URINALYSIS, MICROSCOPIC (REFLEX) - Abnormal; Notable for the following components:  ? Bacteria, UA FEW (*)   ? All other components within normal limits  ?URINE CULTURE  ? ? ?EKG ? ? ?Radiology ?No results found. ? ?Procedures ?Procedures (including critical care time) ? ?Medications Ordered in UC ?Medications - No data to display ? ?Initial Impression / Assessment and Plan / UC Course  ?I have reviewed the triage vital signs and the nursing notes. ? ?Pertinent labs & imaging results that were available during my care of the patient were reviewed by me and considered in my medical decision making (see chart for details). ? ?56 year old female presenting for 3-day history of dysuria, urinary frequency and urgency as well as suprapubic pain.  Noticed blood in urine yesterday.  No fever or flank pain.  Urinalysis performed today shows moderate hemoglobin and small leukocytes as well as budding yeast.  Sending urine for culture.  Treating for UTI and yeast infection.  Sent Macrobid and Diflucan.  Encouraged increasing fluid intake.  Advised development treatment based on culture if  needed.  Follow-up here as needed. ? ? ?Final Clinical Impressions(s) / UC Diagnoses  ? ?Final diagnoses:  ?Dysuria  ?Acute cystitis with hematuria  ?Vaginal yeast infection  ? ? ? ?Discharge Instructions   ? ?  ?

## 2022-02-11 NOTE — ED Triage Notes (Signed)
Patient c/o burning when urinating, suprapubic pain, and urinary frequency that started on Thursday.  Patient denies fevers.  Patient reports some blood in her urine last night.  ?

## 2022-02-11 NOTE — Discharge Instructions (Addendum)

## 2022-02-13 LAB — URINE CULTURE: Culture: >=100000 — AB

## 2022-03-23 ENCOUNTER — Ambulatory Visit
Admission: EM | Admit: 2022-03-23 | Discharge: 2022-03-23 | Disposition: A | Discharge status: Home / Self Care | Payer: BC Managed Care – PPO | Attending: Internal Medicine | Admitting: Internal Medicine

## 2022-03-23 DIAGNOSIS — R300 Dysuria: Secondary | ICD-10-CM | POA: Insufficient documentation

## 2022-03-23 DIAGNOSIS — N3001 Acute cystitis with hematuria: Secondary | ICD-10-CM | POA: Insufficient documentation

## 2022-03-23 DIAGNOSIS — B3731 Acute candidiasis of vulva and vagina: Secondary | ICD-10-CM | POA: Insufficient documentation

## 2022-03-23 LAB — URINALYSIS, MICROSCOPIC (REFLEX)

## 2022-03-23 LAB — URINALYSIS, ROUTINE W REFLEX MICROSCOPIC
Bilirubin Urine: NEGATIVE
Glucose, UA: NEGATIVE mg/dL
Ketones, ur: NEGATIVE mg/dL
Nitrite: NEGATIVE
Protein, ur: NEGATIVE mg/dL
Specific Gravity, Urine: >1.03 — ABNORMAL HIGH (ref 1.005–1.030)
pH: 5 (ref 5.0–8.0)

## 2022-03-23 MED ORDER — CIPROFLOXACIN HCL 500 MG PO TABS: 500.0000 mg | ORAL_TABLET | Freq: Two times a day (BID) | ORAL | 0 refills | Status: AC

## 2022-03-23 MED ORDER — FLUCONAZOLE 150 MG PO TABS: ORAL_TABLET | ORAL | 1 refills | Status: AC

## 2022-03-23 NOTE — ED Provider Notes (Signed)
Also sent [facility]    CSN: [id number] Arrival date & time: 03/23/22  1151      History   Chief Complaint Chief Complaint  Patient presents with   Urinary Tract Infection    HPI Destiny Williamson is a 56 y.o. female presenting for dysuria, urinary urgency with small amounts of urine output and occasional vaginal itching off and on for the past 6 weeks.  I initially saw patient on 02/11/2022 when patient had UTI and yeast infection.  She was treated with Macrobid and Diflucan at that time.  Reported to her primary care provider 2 weeks later and was told her urine test was normal but she continued to have a yeast infection.  Treated again with Diflucan.  Patient reports over the past couple of weeks she has had left-sided flank pain.  Denies any associated fever but has had occasional chills and lower abdominal pressure.  No vomiting.  Patient reports that she took over-the-counter yeast medication last weekend and it helped the vaginal itching.  She has concerns about continued UTI.  No other complaints today.  HPI  Past Medical History:  Diagnosis Date   Thyroid disease     Patient Active Problem List   Diagnosis Date Noted   Dysuria 02/11/2022   Fitting and adjustment of gastrointestinal appliance and device    Biliary colic    Other specified diseases of biliary tract    Common bile duct filling defect, non-specific    Choledocholithiasis with acute cholecystitis 01/31/2016   Fatigue 02/17/2014   Adiposity 02/17/2014   Extremity pain 11/28/2011   FOM (frequency of micturition) 11/28/2011   Adult hypothyroidism 10/31/2011    Past Surgical History:  Procedure Laterality Date   APPENDECTOMY     CHOLECYSTECTOMY N/A 01/31/2016   Procedure: LAPAROSCOPIC CHOLECYSTECTOMY WITH INTRAOPERATIVE CHOLANGIOGRAM;  Surgeon: Gladis Riffle, MD;  Location: [facility];  Service: General;  Laterality: N/A;   ENDOSCOPIC RETROGRADE CHOLANGIOPANCREATOGRAPHY (ERCP) WITH PROPOFOL N/A  02/01/2016   Procedure: ENDOSCOPIC RETROGRADE CHOLANGIOPANCREATOGRAPHY (ERCP) WITH PROPOFOL;  Surgeon: Midge Minium, MD;  Location: [facility];  Service: Endoscopy;  Laterality: N/A;   ERCP N/A 02/17/2016   Procedure: ENDOSCOPIC RETROGRADE CHOLANGIOPANCREATOGRAPHY (ERCP) stent removal;  Surgeon: Midge Minium, MD;  Location: [facility];  Service: Endoscopy;  Laterality: N/A;   TOE SURGERY      OB History   No obstetric history on file.      Home Medications    Prior to Admission medications   Medication Sig Start Date End Date Taking? Authorizing Provider  alendronate (FOSAMAX) 70 MG tablet Take 70 mg by mouth once a week. 10/27/20  Yes [provider]  ciprofloxacin (CIPRO) 500 MG tablet Take 1 tablet (500 mg total) by mouth every 12 (twelve) hours for 7 days. 03/23/22 03/30/22 Yes Shirlee Latch, PA-C  diclofenac (VOLTAREN) 75 MG EC tablet Take 1 tablet (75 mg total) by mouth 2 (two) times daily. Take with food.  Do not take any other anti-inflammatories with this medication including Advil, Motrin, ibuprofen, or Aleve. 11/06/20  Yes Delton See, MD  fluconazole (DIFLUCAN) 150 MG tablet Take 1 tab p.o. every 72 hours for yeast infection 03/23/22  Yes Shirlee Latch, PA-C  levothyroxine (SYNTHROID, LEVOTHROID) 112 MCG tablet Take 125 mcg by mouth every morning. 12/12/17  Yes [provider]  metFORMIN (GLUCOPHAGE) 500 MG tablet Take 1,000 mg by mouth 2 (two) times daily. 04/11/20  Yes [provider]  topiramate (TOPAMAX) 25 MG tablet TAKE  1 TABLET BY MOUTH EVERY DAY AT NIGHT 11/03/20  Yes [provider]  cetirizine (ZYRTEC ALLERGY) 10 MG tablet Take 1 tablet (10 mg total) by mouth daily. 12/19/17 06/02/20  Lutricia Feil, PA-C  fluticasone (FLONASE) 50 MCG/ACT nasal spray Place 2 sprays into both nostrils daily. 12/19/17 06/02/20  Lutricia Feil, PA-C  levonorgestrel (MIRENA) 20 MCG/24HR IUD 20 Intra Uterine Devices by Intrauterine route daily.  06/02/20   [provider]    Family History History reviewed. No pertinent family history.  Social History Social History   Tobacco Use   Smoking status: Former   Smokeless tobacco: Never  Building services engineer Use: Never used  Substance Use Topics   Alcohol use: Yes    Alcohol/week: 0.0 standard drinks of alcohol    Comment: occa   Drug use: No     Allergies   Patient has no known allergies.   Review of Systems Review of Systems  Constitutional:  Positive for chills. Negative for fatigue and fever.  Gastrointestinal:  Positive for abdominal pain. Negative for diarrhea, nausea and vomiting.  Genitourinary:  Positive for dysuria, flank pain, frequency and urgency. Negative for decreased urine volume, hematuria, pelvic pain, vaginal bleeding, vaginal discharge and vaginal pain.  Musculoskeletal:  Negative for back pain.  Skin:  Negative for rash.     Physical Exam Triage Vital Signs ED Triage Vitals  Enc Vitals Group     BP      Pulse      Resp      Temp      Temp src      SpO2      Weight      Height      Head Circumference      Peak Flow      Pain Score      Pain Loc      Pain Edu?      Excl. in GC?    No data found.  Updated Vital Signs BP 122/61 (BP Location: Left Arm)   Pulse 62   Temp 97.8 F (36.6 C) (Oral)   Resp 16   Ht 5\' 8"  (1.727 m)   Wt 213 lb (96.6 kg)   SpO2 100%   BMI 32.39 kg/m     Physical Exam Vitals and nursing note reviewed.  Constitutional:      General: She is not in acute distress.    Appearance: Normal appearance. She is not ill-appearing or toxic-appearing.  HENT:     Head: Normocephalic and atraumatic.     Nose: Nose normal.     Mouth/Throat:     Mouth: Mucous membranes are moist.     Pharynx: Oropharynx is clear.  Eyes:     General: No scleral icterus.       Right eye: No discharge.        Left eye: No discharge.     Conjunctiva/sclera: Conjunctivae normal.  Cardiovascular:     Rate and Rhythm: Normal rate  and regular rhythm.     Heart sounds: Normal heart sounds.  Pulmonary:     Effort: Pulmonary effort is normal. No respiratory distress.     Breath sounds: Normal breath sounds.  Abdominal:     Palpations: Abdomen is soft.     Tenderness: There is abdominal tenderness (suprapubic, LLQ). There is left CVA tenderness (mild). There is no right CVA tenderness.  Musculoskeletal:     Cervical back: Neck supple.  Skin:  General: Skin is dry.  Neurological:     General: No focal deficit present.     Mental Status: She is alert. Mental status is at baseline.     Motor: No weakness.     Gait: Gait normal.  Psychiatric:        Mood and Affect: Mood normal.        Behavior: Behavior normal.        Thought Content: Thought content normal.      UC Treatments / Results  Labs (all labs ordered are listed, but only abnormal results are displayed) Labs Reviewed  URINALYSIS, ROUTINE W REFLEX MICROSCOPIC - Abnormal; Notable for the following components:      Result Value   Specific Gravity, Urine >1.030 (*)    Hgb urine dipstick TRACE (*)    Leukocytes,Ua TRACE (*)    All other components within normal limits  URINALYSIS, MICROSCOPIC (REFLEX) - Abnormal; Notable for the following components:   Bacteria, UA FEW (*)    All other components within normal limits  URINE CULTURE    EKG   Radiology No results found.  Procedures Procedures (including critical care time)  Medications Ordered in UC Medications - No data to display  Initial Impression / Assessment and Plan / UC Course  I have reviewed the triage vital signs and the nursing notes.  Pertinent labs & imaging results that were available during my care of the patient were reviewed by me and considered in my medical decision making (see chart for details).  56 year old female presenting for continued urinary symptoms.  Patient seen on 02/11/2022 and had E. coli in urine.  Treated with Macrobid which should have cover the UTI.   Completed all of medication without improvement in her symptoms.  She also yeast infection at the time of that visit and took Diflucan.  Saw her PCP on 03/01/2022 and was treated again for yeast infection.  Urine culture at that time showed mixed urogenital flora.  Patient now complaining of dysuria, urgency without much urine output, vaginal itching and left-sided flank pain.  Vitals normal and stable and patient overall well-appearing.  Abdomen soft with tenderness palpation of the left lower quadrant, suprapubic region and left CVA tenderness which is mild.  Urinalysis shows trace hemoglobin and trace leukocytes with budding yeast.  We will send urine for culture and treat for UTI and yeast infection again.  We will treat with Cipro at this time.  Selected a higher dose to cover for potential ascending infection given her left flank pain for the past 2 weeks.  Also sent Diflucan.  Encouraged her to increase her fluid intake and follow-up here or with PCP especially if not feeling better soon.  We will call if we need to alter her antibiotic for any reason.   Final Clinical Impressions(s) / UC Diagnoses   Final diagnoses:  Acute cystitis with hematuria  Dysuria  Vaginal yeast infection     Discharge Instructions      UTI: Based on either symptoms or urinalysis, you may have a urinary tract infection. We will send the urine for culture and call with results in a few days. Begin antibiotics at this time. Your symptoms should be much improved over the next 2-3 days. Increase rest and fluid intake. If for some reason symptoms are worsening or not improving after a couple of days or the urine culture determines the antibiotics you are taking will not treat the infection, the antibiotics may be changed. Return or  go to ER for fever, back pain, worsening urinary pain, discharge, increased blood in urine. May take Tylenol or Motrin OTC for pain relief or consider AZO if no contraindications      ED  Prescriptions     Medication Sig Dispense Auth. Provider   ciprofloxacin (CIPRO) 500 MG tablet Take 1 tablet (500 mg total) by mouth every 12 (twelve) hours for 7 days. 14 tablet Eusebio Friendly B, PA-C   fluconazole (DIFLUCAN) 150 MG tablet Take 1 tab p.o. every 72 hours for yeast infection 3 tablet Shirlee Latch, PA-C      PDMP not reviewed this encounter.   Shirlee Latch, PA-C 03/23/22 1253

## 2022-03-24 LAB — URINE CULTURE: Culture: <10000 — AB

## 2023-07-11 ENCOUNTER — Ambulatory Visit
Admission: EM | Admit: 2023-07-11 | Discharge: 2023-07-11 | Disposition: A | Discharge status: Home / Self Care | Payer: BC Managed Care – PPO | Attending: Internal Medicine | Admitting: Internal Medicine

## 2023-07-11 DIAGNOSIS — B349 Viral infection, unspecified: Secondary | ICD-10-CM | POA: Diagnosis present

## 2023-07-11 DIAGNOSIS — R051 Acute cough: Secondary | ICD-10-CM | POA: Diagnosis present

## 2023-07-11 DIAGNOSIS — Z1152 Encounter for screening for COVID-19: Secondary | ICD-10-CM | POA: Insufficient documentation

## 2023-07-11 LAB — RESP PANEL BY RT-PCR (FLU A&B, COVID) ARPGX2
Influenza A by PCR: NEGATIVE
Influenza B by PCR: NEGATIVE
SARS Coronavirus 2 by RT PCR: NEGATIVE

## 2023-07-11 LAB — GROUP A STREP BY PCR: Group A Strep by PCR: NOT DETECTED

## 2023-07-11 MED ORDER — PROMETHAZINE-DM 6.25-15 MG/5ML PO SYRP: 5.0000 mL | ORAL_SOLUTION | Freq: Four times a day (QID) | ORAL | 0 refills | Status: AC | PRN

## 2023-07-11 NOTE — ED Provider Notes (Signed)
[facility]    CSN: [id number] Arrival date & time: 07/11/23  0802      History   Chief Complaint Chief Complaint  Patient presents with   Sore Throat    HPI Destiny Williamson is a 57 y.o. female  presents for evaluation of URI symptoms for 4 days. Patient reports associated symptoms of cough, congestion, ST, body aches. Denies N/V/D, fevers, ear pain, shortness of breath. Patient does not have a hx of asthma. Patient does not have a history of smoking.  Reports  sick contacts via grandson.  Pt has taken cough medicine OTC for symptoms. Pt has no other concerns at this time.    Sore Throat    Past Medical History:  Diagnosis Date   Thyroid disease     Patient Active Problem List   Diagnosis Date Noted   Dysuria 02/11/2022   Fitting and adjustment of gastrointestinal appliance and device    Biliary colic    Other specified diseases of biliary tract    Common bile duct filling defect, non-specific    Choledocholithiasis with acute cholecystitis 01/31/2016   Fatigue 02/17/2014   Adiposity 02/17/2014   Extremity pain 11/28/2011   FOM (frequency of micturition) 11/28/2011   Adult hypothyroidism 10/31/2011    Past Surgical History:  Procedure Laterality Date   APPENDECTOMY     CHOLECYSTECTOMY N/A 01/31/2016   Procedure: LAPAROSCOPIC CHOLECYSTECTOMY WITH INTRAOPERATIVE CHOLANGIOGRAM;  Surgeon: Gladis Riffle, MD;  Location: [facility] ORS;  Service: General;  Laterality: N/A;   ENDOSCOPIC RETROGRADE CHOLANGIOPANCREATOGRAPHY (ERCP) WITH PROPOFOL N/A 02/01/2016   Procedure: ENDOSCOPIC RETROGRADE CHOLANGIOPANCREATOGRAPHY (ERCP) WITH PROPOFOL;  Surgeon: Midge Minium, MD;  Location: [facility];  Service: Endoscopy;  Laterality: N/A;   ERCP N/A 02/17/2016   Procedure: ENDOSCOPIC RETROGRADE CHOLANGIOPANCREATOGRAPHY (ERCP) stent removal;  Surgeon: Midge Minium, MD;  Location: [facility] END[facility];  Service: Endoscopy;  Laterality: N/A;   TOE SURGERY      OB History   No  obstetric history on file.      Home Medications    Prior to Admission medications   Medication Sig Start Date End Date Taking? Authorizing Provider  alendronate (FOSAMAX) 70 MG tablet Take 70 mg by mouth once a week. 10/27/20  Yes [provider]  diclofenac (VOLTAREN) 75 MG EC tablet Take 1 tablet (75 mg total) by mouth 2 (two) times daily. Take with food.  Do not take any other anti-inflammatories with this medication including Advil, Motrin, ibuprofen, or Aleve. 11/06/20  Yes Delton See, MD  levothyroxine (SYNTHROID, LEVOTHROID) 112 MCG tablet Take 125 mcg by mouth every morning. 12/12/17  Yes [provider]  metFORMIN (GLUCOPHAGE) 500 MG tablet Take 1,000 mg by mouth 2 (two) times daily. 04/11/20  Yes [provider]  promethazine-dextromethorphan (PROMETHAZINE-DM) 6.25-15 MG/5ML syrup Take 5 mLs by mouth 4 (four) times daily as needed for cough. 07/11/23  Yes Radford Pax, NP  topiramate (TOPAMAX) 25 MG tablet TAKE 1 TABLET BY MOUTH EVERY DAY AT NIGHT 11/03/20  Yes [provider]  fluconazole (DIFLUCAN) 150 MG tablet Take 1 tab p.o. every 72 hours for yeast infection 03/23/22   Shirlee Latch, PA-C  cetirizine (ZYRTEC ALLERGY) 10 MG tablet Take 1 tablet (10 mg total) by mouth daily. 12/19/17 06/02/20  Lutricia Feil, PA-C  fluticasone (FLONASE) 50 MCG/ACT nasal spray Place 2 sprays into both nostrils daily. 12/19/17 06/02/20  Lutricia Feil, PA-C  levonorgestrel (MIRENA) 20 MCG/24HR IUD 20 Intra Uterine Devices by Intrauterine route daily.  06/02/20  [provider]    Family History History reviewed. No pertinent family history.  Social History Social History   Tobacco Use   Smoking status: Former   Smokeless tobacco: Never  Vaping Use   Vaping status: Never Used  Substance Use Topics   Alcohol use: Yes    Alcohol/week: 0.0 standard drinks of alcohol    Comment: occa   Drug use: No     Allergies   Patient has no known  allergies.   Review of Systems Review of Systems  HENT:  Positive for congestion and sore throat.   Respiratory:  Positive for cough.   Musculoskeletal:  Positive for myalgias.     Physical Exam Triage Vital Signs ED Triage Vitals  Encounter Vitals Group     BP 07/11/23 0826 128/82     Systolic BP Percentile --      Diastolic BP Percentile --      Pulse Rate 07/11/23 0826 82     Resp --      Temp 07/11/23 0826 99.6 F (37.6 C)     Temp Source 07/11/23 0826 Oral     SpO2 07/11/23 0826 96 %     Weight 07/11/23 0823 197 lb (89.4 kg)     Height 07/11/23 0823 5\' 8"  (1.727 m)     Head Circumference --      Peak Flow --      Pain Score 07/11/23 0823 8     Pain Loc --      Pain Education --      Exclude from Growth Chart --    No data found.  Updated Vital Signs BP 128/82 (BP Location: Left Arm)   Pulse 82   Temp 99.6 F (37.6 C) (Oral)   Ht 5\' 8"  (1.727 m)   Wt 197 lb (89.4 kg)   SpO2 96%   BMI 29.95 kg/m   Visual Acuity Right Eye Distance:   Left Eye Distance:   Bilateral Distance:    Right Eye Near:   Left Eye Near:    Bilateral Near:     Physical Exam Vitals and nursing note reviewed.  Constitutional:      General: She is not in acute distress.    Appearance: She is well-developed. She is not ill-appearing.  HENT:     Head: Normocephalic and atraumatic.     Right Ear: Tympanic membrane and ear canal normal.     Left Ear: Tympanic membrane and ear canal normal.     Nose: Congestion present.     Mouth/Throat:     Mouth: Mucous membranes are moist.     Pharynx: Oropharynx is clear. Uvula midline. Posterior oropharyngeal erythema present.     Tonsils: No tonsillar exudate or tonsillar abscesses.  Eyes:     Conjunctiva/sclera: Conjunctivae normal.     Pupils: Pupils are equal, round, and reactive to light.  Cardiovascular:     Rate and Rhythm: Normal rate and regular rhythm.     Heart sounds: Normal heart sounds.  Pulmonary:     Effort: Pulmonary  effort is normal.     Breath sounds: Normal breath sounds.  Musculoskeletal:     Cervical back: Normal range of motion and neck supple.  Lymphadenopathy:     Cervical: No cervical adenopathy.  Skin:    General: Skin is warm and dry.  Neurological:     General: No focal deficit present.     Mental Status: She is alert and oriented to person,  place, and time.  Psychiatric:        Mood and Affect: Mood normal.        Behavior: Behavior normal.      UC Treatments / Results  Labs (all labs ordered are listed, but only abnormal results are displayed) Labs Reviewed  GROUP A STREP BY PCR  RESP PANEL BY RT-PCR (FLU A&B, COVID) [id number]    EKG   Radiology No results found.  Procedures Procedures (including critical care time)  Medications Ordered in UC Medications - No data to display  Initial Impression / Assessment and Plan / UC Course  I have reviewed the triage vital signs and the nursing notes.  Pertinent labs & imaging results that were available during my care of the patient were reviewed by me and considered in my medical decision making (see chart for details).     Reviewed exam and symptoms with patient.  No red flags.  Negative flu, COVID, strep PCR.  Discussed viral illness and symptomatic treatment.  Promethazine DM as needed for cough.  Side effect profile reviewed.  PCP follow-up if symptoms do not improve.  ER precautions reviewed and patient verbalized understanding. Final Clinical Impressions(s) / UC Diagnoses   Final diagnoses:  Acute cough  Viral illness     Discharge Instructions      You may take Promethazine DM as needed for cough.  Please note this medication will make you drowsy.  No alcohol or driving while on this medication.  Lots of rest and fluids such as salt water gargles, teas with honey and lemon.  You may use over-the-counter Tylenol or ibuprofen as needed.  Please follow-up with your PCP if your symptoms do not improve.  Please go to  the ER for any worsening symptoms.  I hope you feel better soon!     ED Prescriptions     Medication Sig Dispense Auth. Provider   promethazine-dextromethorphan (PROMETHAZINE-DM) 6.25-15 MG/5ML syrup Take 5 mLs by mouth 4 (four) times daily as needed for cough. 118 mL Radford Pax, NP      PDMP not reviewed this encounter.   Radford Pax, NP 07/11/23 [phone]

## 2023-07-11 NOTE — ED Triage Notes (Signed)
Pt c/o "pins and needles" in her throat when swallowing, nasal congestion, body chills x4days

## 2023-07-11 NOTE — Discharge Instructions (Signed)
You may take Promethazine DM as needed for cough.  Please note this medication will make you drowsy.  No alcohol or driving while on this medication.  Lots of rest and fluids such as salt water gargles, teas with honey and lemon.  You may use over-the-counter Tylenol or ibuprofen as needed.  Please follow-up with your PCP if your symptoms do not improve.  Please go to the ER for any worsening symptoms.  I hope you feel better soon!

## 2023-10-30 ENCOUNTER — Encounter: Payer: Self-pay | Admitting: Podiatry

## 2023-10-30 ENCOUNTER — Ambulatory Visit (INDEPENDENT_AMBULATORY_CARE_PROVIDER_SITE_OTHER): Payer: Self-pay

## 2023-10-30 ENCOUNTER — Ambulatory Visit (INDEPENDENT_AMBULATORY_CARE_PROVIDER_SITE_OTHER): Payer: Self-pay | Admitting: Podiatry

## 2023-10-30 DIAGNOSIS — M79671 Pain in right foot: Secondary | ICD-10-CM

## 2023-10-30 DIAGNOSIS — M7671 Peroneal tendinitis, right leg: Secondary | ICD-10-CM

## 2023-10-30 DIAGNOSIS — M79672 Pain in left foot: Secondary | ICD-10-CM

## 2023-10-30 MED ORDER — MELOXICAM 15 MG PO TABS: 15.0000 mg | ORAL_TABLET | Freq: Every day | ORAL | 3 refills | Status: AC

## 2023-10-30 NOTE — Patient Instructions (Addendum)
VISIT SUMMARY:  You came in today because of pain in your right ankle, which you have experienced before. The pain is around your ankle and extends slightly upwards and on top of your foot. You have been using ibuprofen and tried an ankle brace, but it made the pain worse. 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  YOUR PLAN:  -PERONEAL TENDONITIS: Peroneal tendonitis is inflammation of the tendons on the outer side of your ankle. This can cause significant pain and is often due to the shape of your foot and the way you walk. We will treat this with a corticosteroid injection to reduce inflammation and pain, and you will take meloxicam for one month. You should wear a lace-up ankle brace for four weeks during walking and work hours and follow a home physical therapy plan twice daily. Please call us if you experience major swelling, bruising, tendon rupture, or worsening symptoms.  -PAINFUL PERONEAL TUBERCLE: A painful peroneal tubercle is a bony bump on the outer side of your ankle that can cause pain and contribute to tendonitis. We will treat this with a corticosteroid injection to reduce inflammation and pain, and you will take meloxicam for one month. You should wear a lace-up ankle brace for four weeks during walking and work hours and follow a home physical therapy plan twice daily. Please call us if you experience major swelling, bruising, tendon rupture, or worsening symptoms.  -PES CAVUS: Pes cavus is a condition where you have a high arch in your foot, which can put extra strain on your tendons and lead to inflammation. We will treat this with a corticosteroid injection to reduce inflammation and pain, and you will take meloxicam for one month. You should wear a lace-up ankle brace for four weeks during walking and work hours and follow a home physical therapy plan twice daily. Please call us if you experience major swelling, bruising,  tendon rupture, or worsening symptoms.  INSTRUCTIONS:  Please schedule a follow-up visit in six weeks. If you experience major swelling, bruising, tendon rupture, or worsening symptoms, call us immediately.  Peroneal Tendinopathy Rehab Ask your health care provider which exercises are safe for you. Do exercises exactly as told by your health care provider and adjust them as directed. It is normal to feel mild stretching, pulling, tightness, or discomfort as you do these exercises. Stop right away if you feel sudden pain or your pain gets worse. Do not begin these exercises until told by your health care provider. Stretching and range-of-motion exercises These exercises warm up your muscles and joints and improve the movement and flexibility of your ankle. These exercises also help to relieve pain and stiffness. Gastroc and soleus stretch, standing  This is an exercise in which you stand on a step and use your body weight to stretch your calf muscles. To do this exercise: Stand on the edge of a step on the ball of your left / right foot. The ball of your foot is on the walking surface, right under your toes. Keep your other foot firmly on the same step. Hold on to the wall, a railing, or a chair for balance. Slowly lift your other foot, allowing your body weight to press your left / right heel down over the edge of the step. You should feel a stretch in your left / right calf (gastrocnemius and soleus). Hold this position for 15 seconds. Return both feet to  the step. Repeat this exercise with a slight bend in your left / right knee. Repeat 5 times with your left / right knee straight and 5 times with your left / right knee bent. Complete this exercise 2 times a day. Strengthening exercises These exercises build strength and endurance in your foot and ankle. Endurance is the ability to use your muscles for a long time, even after they get tired. Ankle dorsiflexion with band   Secure a rubber  exercise band or tube to an object, such as a table leg, that will not move when the band is pulled. Secure the other end of the band around your left / right foot. Sit on the floor, facing the object with your left / right leg extended. The band or tube should be slightly tense when your foot is relaxed. Slowly flex your left / right ankle and toes to bring your foot toward you (dorsiflexion). Hold this position for 15 seconds. Let the band or tube slowly pull your foot back to the starting position. Repeat 5 times. Complete this exercise 2 times a day. Ankle eversion Sit on the floor with your legs straight out in front of you. Loop a rubber exercise band or tube around the ball of your left / right foot. The ball of your foot is on the walking surface, right under your toes. Hold the ends of the band in your hands, or secure the band to a stable object. The band or tube should be slightly tense when your foot is relaxed. Slowly push your foot outward, away from your other leg (eversion). Hold this position for 15 seconds. Slowly return your foot to the starting position. Repeat 5 times. Complete this exercise 2 times a day. Plantar flexion, standing  This exercise is sometimes called standing heel raise. Stand with your feet shoulder-width apart. Place your hands on a wall or table to steady yourself as needed, but try not to use it for support. Keep your weight spread evenly over the width of your feet while you slowly rise up on your toes (plantar flexion). If told by your health care provider: Shift your weight toward your left / right leg until you feel challenged. Stand on your left / right leg only. Hold this position for 15 seconds. Repeat 2 times. Complete this exercise 2 times a day. Single leg stand Without shoes, stand near a railing or in a doorway. You may hold on to the railing or door frame as needed. Stand on your left / right foot. Keep your big toe down on the floor  and try to keep your arch lifted. Do not roll to the outside of your foot. If this exercise is too easy, you can try it with your eyes closed or while standing on a pillow. Hold this position for 15 seconds. Repeat 5 times. Complete this exercise 2 times a day. This information is not intended to replace advice given to you by your health care provider. Make sure you discuss any questions you have with your health care provider. Document Revised: 01/06/2019 Document Reviewed: 01/06/2019 Elsevier Patient Education  2020 [employer].

## 2023-10-30 NOTE — Progress Notes (Signed)
Subjective:  Patient ID: Destiny Williamson, female    DOB: [birth year],  MRN: [id number]  Chief Complaint  Patient presents with   Foot Pain    Pt is here due to right foot pain, she states pain started about 2 weeks ago, no injury to foot, foot is swelling at the ankle and tender to touch at the top.states she has had no relief with OTC medicines.    Discussed the use of AI scribe software for clinical note transcription with the patient, who gave verbal consent to proceed.  History of Present Illness   The patient presents with right ankle pain.  She experiences pain around her right ankle, similar to a previous episode years ago that was relieved by a corticosteroid injection. The pain is located all around the ankle, extending slightly upwards and on top of the foot. She notes that she caught the pain early this time, unlike the previous episode where she continued to work despite the pain.  She has tried wearing a slide-on ankle brace but feels it exacerbates the pain. She has been taking ibuprofen frequently for pain relief. She reports soreness and tenderness in specific areas around the ankle, particularly in the peroneal region, and identifies a specific spot as the worst area of pain.  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 No allergies.          Objective:    Physical Exam   MUSCULOSKELETAL: Mild pain in anterior ankle and sinus trachea, significant and severe pain in inframallular area and peroneal sulcus, very prominent peroneal tubercle with pain to palpation, no pain at insertion of peroneus brevis, sulcus of cuboid, or retromallular groove.       No images are attached to the encounter.    Results   Procedure: Corticosteroid injection Description: The lateral ankle at the site of the perineal tubercle was injected with 4 mg of dexamethasone phosphate and 0.5 cc of 0.5% bupivacaine. The  patient tolerated the procedure well and was dressed with a Band-Aid. Informed Consent: Discussed treatment options including operative and non-operative treatments. Recommended non-operative treatment with corticosteroid injection, ankle brace support, and home physical therapy. Explained the risks, benefits, and alternatives. Patient consented to the procedure.  RADIOLOGY Ankle Radiographs: Pescavis foot type with no fracture or dislocation, previous fifth metatarsal osteotomy (10/30/2023)      Assessment:   1. Peroneal tendinitis of right lower extremity      Plan:  Patient was evaluated and treated and all questions answered.  Assessment and Plan    Peroneal Tendonitis Recurrent inflammation of the peroneal tendons is exacerbated by pes cavus and a prominent peroneal tubercle, causing significant pain in the inframalleolar area and peroneal sulcus. Radiographs show no fracture or dislocation. Nonoperative treatment is recommended to relieve pain and reduce inflammation, with a low risk of chronic inflammation leading to small tears or tendon rupture. Administer a corticosteroid injection (4 mg dexamethasone phosphate and 0.5 cc of 0.5% marcaine plain) to the lateral ankle at the peroneal tubercle. Prescribe meloxicam for one month. Advise wearing a lace-up ankle brace for four weeks during walking and work hours. Dispense a home physical therapy plan to be performed twice daily. Schedule a follow-up visit in six weeks. Instruct to call if experiencing major swelling, bruising, tendon rupture, or worsening symptoms.  Painful Peroneal Tubercle Pain at the prominent peroneal tubercle contributes to peroneal tendonitis. Nonoperative treatment is preferred  to prevent further strain, offering pain relief and reduced inflammation. Administer a corticosteroid injection (4 mg dexamethasone phosphate and 0.5 cc of 0.5% marcaine plain) to the lateral ankle at the peroneal tubercle. Prescribe meloxicam  for one month. Advise wearing a lace-up ankle brace for four weeks during walking and work hours. Dispense a home physical therapy plan to be performed twice daily. Schedule a follow-up visit in six weeks. Instruct to call if experiencing major swelling, bruising, tendon rupture, or worsening symptoms.  Pes Cavus A high arched foot increases strain on the peroneal tendons, leading to recurrent inflammation. Radiographs show no fracture or dislocation. Treatment aims to improve foot support and reduce tendon strain. Administer a corticosteroid injection (4 mg dexamethasone phosphate and 0.5 cc of 0.5% marcaine plain) to the lateral ankle at the peroneal tubercle. Prescribe meloxicam for one month. Advise wearing a lace-up ankle brace for four weeks during walking and work hours. Dispense a home physical therapy plan to be performed twice daily. Schedule a follow-up visit in six weeks. Instruct to call if experiencing major swelling, bruising, tendon rupture, or worsening symptoms.          Return in about 6 weeks (around 12/11/2023) for re-check peroneal tendinitis.

## 2023-12-11 ENCOUNTER — Ambulatory Visit: Payer: Self-pay | Admitting: Podiatry

## 2024-08-07 ENCOUNTER — Encounter: Payer: Self-pay | Admitting: Emergency Medicine

## 2024-08-07 ENCOUNTER — Ambulatory Visit
Admission: EM | Admit: 2024-08-07 | Discharge: 2024-08-07 | Disposition: A | Discharge status: Home / Self Care | Attending: Emergency Medicine | Admitting: Emergency Medicine

## 2024-08-07 DIAGNOSIS — R490 Dysphonia: Secondary | ICD-10-CM

## 2024-08-07 LAB — POCT RAPID STREP A (OFFICE): Rapid Strep A Screen: NEGATIVE

## 2024-08-07 MED ORDER — BENZONATATE 100 MG PO CAPS: 100.0000 mg | ORAL_CAPSULE | Freq: Three times a day (TID) | ORAL | 0 refills | Status: AC

## 2024-08-07 NOTE — ED Provider Notes (Signed)
 [facility]    CSN: [id number] Arrival date & time: 08/07/24  9187      History   Chief Complaint Chief Complaint  Patient presents with   Hoarse    HPI Destiny Williamson is a 58 y.o. female.   58 year old female, Destiny Williamson, prents to urgent care for evaluation of hoarseness, post nasal drip.   No meds taken.   Work in [employer].   The history is provided by the patient. No language interpreter was used.    Past Medical History:  Diagnosis Date   Thyroid disease     Patient Active Problem List   Diagnosis Date Noted   Hoarseness 08/07/2024   Dysuria 02/11/2022   Fitting and adjustment of gastrointestinal appliance and device    Biliary colic    Other specified diseases of biliary tract    Common bile duct filling defect, non-specific    Choledocholithiasis with acute cholecystitis 01/31/2016   Fatigue 02/17/2014   Adiposity 02/17/2014   Extremity pain 11/28/2011   FOM (frequency of micturition) 11/28/2011   Adult hypothyroidism 10/31/2011    Past Surgical History:  Procedure Laterality Date   APPENDECTOMY     CHOLECYSTECTOMY N/A 01/31/2016   Procedure: LAPAROSCOPIC CHOLECYSTECTOMY WITH INTRAOPERATIVE CHOLANGIOGRAM;  Surgeon: Dorothyann LITTIE Husk, MD;  Location: [facility];  Service: General;  Laterality: N/A;   ENDOSCOPIC RETROGRADE CHOLANGIOPANCREATOGRAPHY (ERCP) WITH PROPOFOL  N/A 02/01/2016   Procedure: ENDOSCOPIC RETROGRADE CHOLANGIOPANCREATOGRAPHY (ERCP) WITH PROPOFOL ;  Surgeon: Rogelia Copping, MD;  Location: [facility];  Service: Endoscopy;  Laterality: N/A;   ERCP N/A 02/17/2016   Procedure: ENDOSCOPIC RETROGRADE CHOLANGIOPANCREATOGRAPHY (ERCP) stent removal;  Surgeon: Rogelia Copping, MD;  Location: [facility];  Service: Endoscopy;  Laterality: N/A;   TOE SURGERY      OB History   No obstetric history on file.      Home Medications    Prior to Admission medications   Medication Sig Start Date End Date Taking? Authorizing Provider   alendronate (FOSAMAX) 70 MG tablet Take 70 mg by mouth once a week. 10/27/20  Yes [provider]  benzonatate  (TESSALON ) 100 MG capsule Take 1 capsule (100 mg total) by mouth every 8 (eight) hours. 08/07/24  Yes Vin Yonke, Rilla, NP  levothyroxine  (SYNTHROID , LEVOTHROID) 112 MCG tablet Take 125 mcg by mouth every morning. 12/12/17  Yes [provider]  metFORMIN (GLUCOPHAGE) 500 MG tablet Take 1,000 mg by mouth 2 (two) times daily. 04/11/20  Yes [provider]  topiramate (TOPAMAX) 25 MG tablet TAKE 1 TABLET BY MOUTH EVERY DAY AT NIGHT 11/03/20  Yes [provider]  fluconazole  (DIFLUCAN ) 150 MG tablet Take 1 tab p.o. every 72 hours for yeast infection 03/23/22   Arvis Huxley B, PA-C  meloxicam  (MOBIC ) 15 MG tablet Take 1 tablet (15 mg total) by mouth daily. 10/30/23   McDonald, Juliene SAUNDERS, DPM  promethazine -dextromethorphan (PROMETHAZINE -DM) 6.25-15 MG/5ML syrup Take 5 mLs by mouth 4 (four) times daily as needed for cough. 07/11/23   Mayer, Jodi R, NP  cetirizine  (ZYRTEC  ALLERGY) 10 MG tablet Take 1 tablet (10 mg total) by mouth daily. 12/19/17 06/02/20  Lacinda Elsie SQUIBB, PA-C  fluticasone  (FLONASE ) 50 MCG/ACT nasal spray Place 2 sprays into both nostrils daily. 12/19/17 06/02/20  Lacinda Elsie SQUIBB, PA-C  levonorgestrel (MIRENA) 20 MCG/24HR IUD 20 Intra Uterine Devices by Intrauterine route daily.  06/02/20  [provider]    Family History History reviewed. No pertinent family history.  Social History Social History  Tobacco Use   Smoking status: Former   Smokeless tobacco: Never  Vaping Use   Vaping status: Never Used  Substance Use Topics   Alcohol use: Yes    Alcohol/week: 0.0 standard drinks of alcohol    Comment: occa   Drug use: No     Allergies   Patient has no known allergies.   Review of Systems Review of Systems  HENT:  Positive for postnasal drip, sore throat and voice change.   All other systems reviewed and are  negative.    Physical Exam Triage Vital Signs ED Triage Vitals  Encounter Vitals Group     BP 08/07/24 0824 127/77     Girls Systolic BP Percentile --      Girls Diastolic BP Percentile --      Boys Systolic BP Percentile --      Boys Diastolic BP Percentile --      Pulse Rate 08/07/24 0824 62     Resp 08/07/24 0824 16     Temp 08/07/24 0824 98.8 F (37.1 C)     Temp Source 08/07/24 0824 Oral     SpO2 08/07/24 0824 97 %     Weight --      Height 08/07/24 0823 5' 8 (1.727 m)     Head Circumference --      Peak Flow --      Pain Score 08/07/24 0823 0     Pain Loc --      Pain Education --      Exclude from Growth Chart --    No data found.  Updated Vital Signs BP 127/77 (BP Location: Left Arm)   Pulse 62   Temp 98.8 F (37.1 C) (Oral)   Resp 16   Ht 5' 8 (1.727 m)   SpO2 97%   BMI 29.95 kg/m   Visual Acuity Right Eye Distance:   Left Eye Distance:   Bilateral Distance:    Right Eye Near:   Left Eye Near:    Bilateral Near:     Physical Exam Vitals and nursing note reviewed.  Constitutional:      Appearance: Normal appearance. She is well-developed and well-groomed.  Cardiovascular:     Rate and Rhythm: Normal rate and regular rhythm.     Heart sounds: Normal heart sounds.  Pulmonary:     Effort: Pulmonary effort is normal.     Breath sounds: Normal breath sounds and air entry.  Neurological:     General: No focal deficit present.     Mental Status: She is alert and oriented to person, place, and time.     GCS: GCS eye subscore is 4. GCS verbal subscore is 5. GCS motor subscore is 6.  Psychiatric:        Attention and Perception: Attention normal.        Mood and Affect: Mood normal.        Behavior: Behavior is cooperative.      UC Treatments / Results  Labs (all labs ordered are listed, but only abnormal results are displayed) Labs Reviewed  POCT RAPID STREP A (OFFICE)    EKG   Radiology No results found.  Procedures Procedures  (including critical care time)  Medications Ordered in UC Medications - No data to display  Initial Impression / Assessment and Plan / UC Course  I have reviewed the triage vital signs and the nursing notes.  Pertinent labs & imaging results that were available during my care of the patient  were reviewed by me and considered in my medical decision making (see chart for details).    Discussed exam findings and plan of care with patient, strict go to ER precautions given.   Patient verbalized understanding to this provider.  Ddx: Hoarseness, viral illness, pharyngitis, allergies Final Clinical Impressions(s) / UC Diagnoses   Final diagnoses:  Hoarseness     Discharge Instructions      Most likely you have a viral illness: no antibiotic is indicated at this time, May treat with OTC meds of choice. Make sure to drink plenty of fluids to stay hydrated(gatorade, water, popsicles,jello,etc), avoid caffeine products. Follow up with PCP. Return as needed.     ED Prescriptions     Medication Sig Dispense Auth. Provider   benzonatate  (TESSALON ) 100 MG capsule Take 1 capsule (100 mg total) by mouth every 8 (eight) hours. 21 capsule Nishaan Stanke, NP      PDMP not reviewed this encounter.   Aminta Loose, NP 08/07/24 1336

## 2024-08-07 NOTE — ED Triage Notes (Signed)
 Pt c/o hoarseness, post nasal drip. Started yesterday. She states she does not feel sick but that her tonsils feel swollen. Denies fever or pain.

## 2024-08-07 NOTE — Discharge Instructions (Addendum)
Most likely you have a viral illness: no antibiotic is indicated at this time, May treat with OTC meds of choice. Make sure to drink plenty of fluids to stay hydrated(gatorade, water, popsicles,jello,etc), avoid caffeine products. Follow up with PCP. Return as needed.
